# Patient Record
Sex: Female | Born: 1965
Health system: Southern US, Community
[De-identification: ages and names within clinical notes are randomized; demographics above are authoritative.]

## PROBLEM LIST (undated history)

## (undated) DIAGNOSIS — F1990 Other psychoactive substance use, unspecified, uncomplicated: Secondary | ICD-10-CM

## (undated) DIAGNOSIS — F988 Other specified behavioral and emotional disorders with onset usually occurring in childhood and adolescence: Secondary | ICD-10-CM

## (undated) DIAGNOSIS — E039 Hypothyroidism, unspecified: Secondary | ICD-10-CM

## (undated) DIAGNOSIS — R000 Tachycardia, unspecified: Secondary | ICD-10-CM

## (undated) DIAGNOSIS — E559 Vitamin D deficiency, unspecified: Secondary | ICD-10-CM

## (undated) DIAGNOSIS — M549 Dorsalgia, unspecified: Secondary | ICD-10-CM

## (undated) DIAGNOSIS — K219 Gastro-esophageal reflux disease without esophagitis: Secondary | ICD-10-CM

## (undated) DIAGNOSIS — I471 Supraventricular tachycardia: Secondary | ICD-10-CM

## (undated) DIAGNOSIS — R002 Palpitations: Secondary | ICD-10-CM

## (undated) DIAGNOSIS — L299 Pruritus, unspecified: Secondary | ICD-10-CM

## (undated) DIAGNOSIS — G9332 Myalgic encephalomyelitis/chronic fatigue syndrome: Secondary | ICD-10-CM

## (undated) DIAGNOSIS — R5383 Other fatigue: Secondary | ICD-10-CM

## (undated) DIAGNOSIS — R5382 Chronic fatigue, unspecified: Secondary | ICD-10-CM

## (undated) DIAGNOSIS — M2550 Pain in unspecified joint: Secondary | ICD-10-CM

## (undated) DIAGNOSIS — F32A Depression, unspecified: Secondary | ICD-10-CM

## (undated) DIAGNOSIS — T7840XA Allergy, unspecified, initial encounter: Secondary | ICD-10-CM

## (undated) DIAGNOSIS — F329 Major depressive disorder, single episode, unspecified: Secondary | ICD-10-CM

## (undated) DIAGNOSIS — R0602 Shortness of breath: Secondary | ICD-10-CM

## (undated) DIAGNOSIS — R600 Localized edema: Secondary | ICD-10-CM

## (undated) DIAGNOSIS — N979 Female infertility, unspecified: Secondary | ICD-10-CM

## (undated) DIAGNOSIS — F419 Anxiety disorder, unspecified: Secondary | ICD-10-CM

## (undated) DIAGNOSIS — J069 Acute upper respiratory infection, unspecified: Secondary | ICD-10-CM

## (undated) DIAGNOSIS — K5900 Constipation, unspecified: Secondary | ICD-10-CM

## (undated) HISTORY — DX: Shortness of breath: R06.02

## (undated) HISTORY — DX: Gastro-esophageal reflux disease without esophagitis: K21.9

## (undated) HISTORY — DX: Constipation, unspecified: K59.00

## (undated) HISTORY — DX: Allergy, unspecified, initial encounter: T78.40XA

## (undated) HISTORY — DX: Other psychoactive substance use, unspecified, uncomplicated: F19.90

## (undated) HISTORY — DX: Female infertility, unspecified: N97.9

## (undated) HISTORY — PX: BLADDER REPAIR: SHX76

## (undated) HISTORY — DX: Anxiety disorder, unspecified: F41.9

## (undated) HISTORY — DX: Acute upper respiratory infection, unspecified: J06.9

## (undated) HISTORY — DX: Pain in unspecified joint: M25.50

## (undated) HISTORY — DX: Tachycardia, unspecified: R00.0

## (undated) HISTORY — PX: OTHER SURGICAL HISTORY: SHX169

## (undated) HISTORY — DX: Myalgic encephalomyelitis/chronic fatigue syndrome: G93.32

## (undated) HISTORY — DX: Supraventricular tachycardia: I47.1

## (undated) HISTORY — DX: Vitamin D deficiency, unspecified: E55.9

## (undated) HISTORY — PX: ADENOIDECTOMY: SUR15

## (undated) HISTORY — DX: Hypothyroidism, unspecified: E03.9

## (undated) HISTORY — DX: Chronic fatigue, unspecified: R53.82

## (undated) HISTORY — DX: Dorsalgia, unspecified: M54.9

## (undated) HISTORY — DX: Major depressive disorder, single episode, unspecified: F32.9

## (undated) HISTORY — PX: LAPAROSCOPY: SHX197

## (undated) HISTORY — DX: Depression, unspecified: F32.A

## (undated) HISTORY — DX: Pruritus, unspecified: L29.9

## (undated) HISTORY — DX: Localized edema: R60.0

## (undated) HISTORY — DX: Other fatigue: R53.83

## (undated) HISTORY — DX: Other specified behavioral and emotional disorders with onset usually occurring in childhood and adolescence: F98.8

## (undated) HISTORY — DX: Palpitations: R00.2

---

## 1999-02-03 ENCOUNTER — Other Ambulatory Visit: Admission: RE | Admit: 1999-02-03 | Discharge: 1999-02-03 | Payer: Self-pay | Admitting: *Deleted

## 1999-03-07 ENCOUNTER — Emergency Department (HOSPITAL_COMMUNITY): Admission: EM | Admit: 1999-03-07 | Discharge: 1999-03-07 | Payer: Self-pay | Admitting: *Deleted

## 1999-11-30 ENCOUNTER — Other Ambulatory Visit: Admission: RE | Admit: 1999-11-30 | Discharge: 1999-11-30 | Payer: Self-pay | Admitting: *Deleted

## 2000-12-05 ENCOUNTER — Other Ambulatory Visit: Admission: RE | Admit: 2000-12-05 | Discharge: 2000-12-05 | Payer: Self-pay | Admitting: *Deleted

## 2002-04-15 ENCOUNTER — Ambulatory Visit (HOSPITAL_COMMUNITY): Admission: RE | Admit: 2002-04-15 | Discharge: 2002-04-15 | Payer: Self-pay | Admitting: Obstetrics and Gynecology

## 2002-05-06 ENCOUNTER — Inpatient Hospital Stay (HOSPITAL_COMMUNITY): Admission: RE | Admit: 2002-05-06 | Discharge: 2002-05-08 | Payer: Self-pay | Admitting: Obstetrics and Gynecology

## 2002-05-06 ENCOUNTER — Encounter (INDEPENDENT_AMBULATORY_CARE_PROVIDER_SITE_OTHER): Payer: Self-pay

## 2002-08-15 ENCOUNTER — Other Ambulatory Visit: Admission: RE | Admit: 2002-08-15 | Discharge: 2002-08-15 | Payer: Self-pay | Admitting: Obstetrics and Gynecology

## 2003-05-28 ENCOUNTER — Other Ambulatory Visit: Admission: RE | Admit: 2003-05-28 | Discharge: 2003-05-28 | Payer: Self-pay | Admitting: Obstetrics and Gynecology

## 2004-03-30 ENCOUNTER — Inpatient Hospital Stay (HOSPITAL_COMMUNITY): Admission: RE | Admit: 2004-03-30 | Discharge: 2004-04-05 | Payer: Self-pay | Admitting: Obstetrics and Gynecology

## 2004-04-09 ENCOUNTER — Emergency Department (HOSPITAL_COMMUNITY): Admission: EM | Admit: 2004-04-09 | Discharge: 2004-04-09 | Payer: Self-pay | Admitting: Emergency Medicine

## 2004-05-09 ENCOUNTER — Other Ambulatory Visit: Admission: RE | Admit: 2004-05-09 | Discharge: 2004-05-09 | Payer: Self-pay | Admitting: Obstetrics and Gynecology

## 2005-05-18 ENCOUNTER — Other Ambulatory Visit: Admission: RE | Admit: 2005-05-18 | Discharge: 2005-05-18 | Payer: Self-pay | Admitting: Obstetrics and Gynecology

## 2008-01-27 ENCOUNTER — Ambulatory Visit: Payer: Self-pay | Admitting: Internal Medicine

## 2008-01-27 DIAGNOSIS — F329 Major depressive disorder, single episode, unspecified: Secondary | ICD-10-CM | POA: Insufficient documentation

## 2008-01-27 DIAGNOSIS — Z87448 Personal history of other diseases of urinary system: Secondary | ICD-10-CM | POA: Insufficient documentation

## 2008-01-27 DIAGNOSIS — R519 Headache, unspecified: Secondary | ICD-10-CM | POA: Insufficient documentation

## 2008-01-27 DIAGNOSIS — J302 Other seasonal allergic rhinitis: Secondary | ICD-10-CM | POA: Insufficient documentation

## 2008-01-27 DIAGNOSIS — R51 Headache: Secondary | ICD-10-CM | POA: Insufficient documentation

## 2008-01-27 DIAGNOSIS — J309 Allergic rhinitis, unspecified: Secondary | ICD-10-CM

## 2008-01-27 DIAGNOSIS — F32A Depression, unspecified: Secondary | ICD-10-CM | POA: Insufficient documentation

## 2008-01-27 LAB — CONVERTED CEMR LAB
Glucose, Urine, Semiquant: NEGATIVE
Nitrite: NEGATIVE
Specific Gravity, Urine: 1.025
Urobilinogen, UA: 0.2

## 2008-02-15 ENCOUNTER — Encounter: Payer: Self-pay | Admitting: Emergency Medicine

## 2008-02-16 ENCOUNTER — Inpatient Hospital Stay (HOSPITAL_COMMUNITY): Admission: EM | Admit: 2008-02-16 | Discharge: 2008-02-17 | Payer: Self-pay | Admitting: Cardiology

## 2008-02-16 ENCOUNTER — Ambulatory Visit: Payer: Self-pay | Admitting: Cardiology

## 2008-02-17 ENCOUNTER — Encounter: Payer: Self-pay | Admitting: Internal Medicine

## 2008-03-03 ENCOUNTER — Ambulatory Visit: Payer: Self-pay

## 2008-03-03 ENCOUNTER — Ambulatory Visit: Payer: Self-pay | Admitting: Cardiology

## 2008-03-10 ENCOUNTER — Encounter: Payer: Self-pay | Admitting: Internal Medicine

## 2008-03-10 ENCOUNTER — Telehealth: Payer: Self-pay | Admitting: *Deleted

## 2008-03-10 ENCOUNTER — Telehealth: Payer: Self-pay | Admitting: Internal Medicine

## 2008-03-10 LAB — CONVERTED CEMR LAB
AST: 17 units/L (ref 0–37)
Albumin: 3.7 g/dL (ref 3.5–5.2)
Basophils Absolute: 0.1 10*3/uL (ref 0.0–0.1)
CO2: 27 meq/L (ref 19–32)
Calcium: 9 mg/dL (ref 8.4–10.5)
Chloride: 111 meq/L (ref 96–112)
Creatinine, Ser: 0.6 mg/dL (ref 0.4–1.2)
Eosinophils Absolute: 0.1 10*3/uL (ref 0.0–0.7)
GFR calc Af Amer: 141 mL/min
HCT: 37.4 % (ref 36.0–46.0)
LDL Cholesterol: 98 mg/dL (ref 0–99)
MCV: 88.5 fL (ref 78.0–100.0)
Monocytes Relative: 9 % (ref 3.0–12.0)
Neutro Abs: 4 10*3/uL (ref 1.4–7.7)
Neutrophils Relative %: 65.6 % (ref 43.0–77.0)
Potassium: 4.4 meq/L (ref 3.5–5.1)
RBC: 4.22 M/uL (ref 3.87–5.11)
Sodium: 144 meq/L (ref 135–145)
Total Bilirubin: 1 mg/dL (ref 0.3–1.2)
Total CHOL/HDL Ratio: 4.1

## 2008-04-22 ENCOUNTER — Ambulatory Visit: Payer: Self-pay | Admitting: Internal Medicine

## 2008-04-22 DIAGNOSIS — J019 Acute sinusitis, unspecified: Secondary | ICD-10-CM | POA: Insufficient documentation

## 2008-04-22 DIAGNOSIS — J069 Acute upper respiratory infection, unspecified: Secondary | ICD-10-CM | POA: Insufficient documentation

## 2008-04-23 ENCOUNTER — Telehealth: Payer: Self-pay | Admitting: Internal Medicine

## 2008-04-24 ENCOUNTER — Telehealth: Payer: Self-pay | Admitting: Internal Medicine

## 2008-08-17 ENCOUNTER — Encounter: Admission: RE | Admit: 2008-08-17 | Discharge: 2008-08-17 | Payer: Self-pay | Admitting: Obstetrics and Gynecology

## 2008-10-17 ENCOUNTER — Emergency Department (HOSPITAL_COMMUNITY): Admission: EM | Admit: 2008-10-17 | Discharge: 2008-10-17 | Payer: Self-pay | Admitting: Family Medicine

## 2010-11-29 NOTE — Discharge Summary (Signed)
NAMEMarland Kitchen  KINBERLY, PERRIS NO.:  [id number]   MEDICAL RECORD NO.:  [id number]          PATIENT TYPE:  INP   LOCATION:  2005                         FACILITY:  [facility]   PHYSICIAN:  Rollene Rotunda, MD, FACCDATE OF BIRTH:  17-Feb-1966   DATE OF ADMISSION:  02/16/2008  DATE OF DISCHARGE:  02/17/2008                               DISCHARGE SUMMARY   PROCEDURE:  Abdominal ultrasound.   PRIMARY FINAL DISCHARGE DIAGNOSIS:  Chest pain, cardiac enzymes negative  for myocardial infarction and outpatient Myoview planned.   SECONDARY DIAGNOSES:  1. Dyslipidemia with a total cholesterol of 151, triglycerides 67, HDL      35, and LDL 103.  2. Upper abdominal pain with abdominal ultrasound unremarkable,      amylase and lipase within normal limits.  3. Elevated TSH at 6.458 with a free T3 and T4 within normal limits.  4. Status post uterine myomectomy, laparoscopic surgery, C-section,      bladder tack, and polyps removal.  5. Allergy or intolerance to PHENERGAN.  6. Family history of coronary artery disease in her father.   TIME AT DISCHARGE:  36 minutes.   HOSPITAL COURSE:  Ms. Stapleton is a 45 year old female with no previous  history of coronary artery disease.  She went to the [facility] with chest discomfort and her EKG was a little abnormal.  She was  transferred to [facility] for further evaluation and treatment.   Her cardiac enzymes were negative for MI.  Lipid profile was mildly  abnormal as described above.  TSH was also mildly abnormal with a free  T3 and T4 within normal limits.  Serum pregnancy test was negative.  There was some concern for an abdominal etiology of his symptoms, but  amylase, lipase, and abdominal ultrasound were all within normal limits.  A D-dimer was also checked and was within normal limits.   On February 17, 2008, Ms. Gronewold was seen by Dr. Antoine Poche who felt she was  stable for discharge and outpatient stress testing.   DISCHARGE  INSTRUCTIONS:  She is to stick to a low-sodium, heart-healthy  diet.  She is to increase her activities slowly.  She is to follow up  with Dr. Antoine Poche on August 18 at 4 p.m.[name]  She is to get a stress test  on  August 4 at 8:15 a.m. with no food or drink after midnight before and no  caffeine.  She needs to follow up with Dr. Fabian Sharp as needed.   DISCHARGE MEDICATIONS:  Aspirin 81 mg daily.      Theodore Demark, PA-C      Rollene Rotunda, MD, [facility]  Electronically Signed    RB/MEDQ  D:  02/17/2008  T:  02/17/2008  Job:  [id number]   cc:   Neta Mends. Fabian Sharp, MD

## 2010-11-29 NOTE — Procedures (Signed)
[facility]                              EXERCISE TREADMILL   EVOLEHT, HOVATTER                         MRN:          [id number]  DATE:03/03/2008                            DOB:          05/03/1966    PROCEDURE:  Exercise treadmill test.   INDICATIONS:  The patient with chest pain and recent hospitalization.  She ruled out for myocardial infarction.   PROCEDURE NOTE:  The patient was exercised using standard Bruce  protocol.  She was able to exercise for 9 minutes.  This completed stage  III.  Test was terminated because she achieved her target heart rate.  She was fatigued, but probably could have gone a little  further.  She  achieved maximum heart rate of 181, which was 101% of predicted.  She  achieved had 10.4 METS.  Her blood pressure response was appropriate.  There was a spurious elevation in stage I.  Other blood pressures taken  were all consistently going up with exercise.  There was rare ectopy  during peak exercise.  There were PVCs and PACs.  There were no  sustained arrhythmias.  She had appropriate shortness of breath.  She  did not have chest discomfort.  She had no ischemic ST-T wave changes.  She had a normal heart rate recovery.   CONCLUSION:  Negative adequate exercise treadmill test.  There were no  high-risk features consistent with obstructive high-grade coronary  disease.  Therefore, no further cardiovascular testing is suggested at  this time.     Rollene Rotunda, MD, [facility]  Electronically Signed    JH/MedQ  DD: 03/03/2008  DT: 03/04/2008  Job #: [id number]   cc:   Neta Mends. Fabian Sharp, MD

## 2010-11-29 NOTE — H&P (Signed)
NAME:  [name], [name] NO.:  [id number]   MEDICAL RECORD NO.:  [id number]          PATIENT TYPE:  INP   LOCATION:  2005                         FACILITY:  [facility]   PHYSICIAN:  Rollene Rotunda, MD, FACCDATE OF BIRTH:  1965-08-26   DATE OF ADMISSION:  02/16/2008  DATE OF DISCHARGE:                              HISTORY & PHYSICAL   PRIMARY:  Neta Mends. Panosh, MD.   CARDIOLOGIST:  None.   REASON FOR PRESENTATION:  Evaluate the patient with chest pain.   HISTORY OF PRESENT ILLNESS:  The patient is a 45 year old white female  without prior cardiac history.  She presented today to the [facility] with chest discomfort.  This happened at rest.  She had a  sensation like there was an elephant on her chest.  It was about 5/10 in  intensity.  She had not had this discomfort before.  She had some pain  as well that radiated from her chest through to her back.  She developed  this after the pressure.  There was no radiation to her jaw or to her  arms.  She did feel like her heart was fluttering or skipping.  She has  had this before but it was slightly more intense than previous.  She  felt like she could not take a deep breath.  However, the discomfort was  not exacerbated by movement or breathing.  She felt fatigued.  She felt  a little diaphoretic.  She felt a little short of breath.  In [facility], she was not noted to have any acute EKG changes but was  referred to emergency room.  She refused an ambulance and was driven by  private car to the [facility] ED.  There on EKG, now pain  free, the patient had T-wave inversions in the anterior leads which was  changed from the one earlier in the day.  She has had no recurrent chest  discomfort.  Point of care markers were negative.  She was start on  nitroglycerin paste and sent here for further evaluation.  She has had  no further discomfort.   The patient does such activities as vacuuming and  chasing after a very  active 4-year-old.  She has had some increased fatigue with this.  She  also had some food intolerances and noted that she had been getting  somewhat nauseated with some food.  She has been a little more short of  breath doing such activities as vacuuming.  She has had some pain on the  right side about 2 weeks ago and some fleeting pain on left side more  recently.  She has not had any presyncope or syncope.  She has had no  PND or orthopnea.  She has had no prior cardiac testing.   Of note, the patient has taken a recent car trip to [place].  However,  she has had no calf tenderness.  Her D-dimer was less than 0.48 at the  emergency room.   PAST MEDICAL HISTORY:  The patient has no history of:  1. Hypertension.  2. Diabetes.  3. Hyperlipidemia.   PAST SURGICAL HISTORY:  1. Uterine myomectomy.  2. Polyps.  3. Laparoscopic surgery.  4. C-section.  5. Bladder surgery.   ALLERGIES/INTOLERANCES:  PHENERGAN MAKES HER NAUSEATED.   MEDICATIONS:  None.   SOCIAL HISTORY:  The patient is married.  She has one 28-year-old with  ADHD.  She quit smoking about 10 years ago.   FAMILY HISTORY:  Is contributory for her father having coronary disease  in his 10s.   REVIEW OF SYSTEMS:  As stated in the HPI, positive for fatigue, food  intolerance, stress at home.  Otherwise, negative for all other systems.   PHYSICAL EXAMINATION:  The patient is in no distress.  She has a  pleasant affect.  Blood pressure 110/63, heart rate 70, afebrile,  respiratory rate 16.  HEENT:  Eyelids unremarkable, pupils equal, round and react to light,  fundi not visualized, oral mucosa unremarkable.  NECK:  No jugular venous distention at 45 degrees, carotid upstroke  brisk and symmetric, no bruits, no thyromegaly.  LYMPHATICS:  No cervical, axillary or inguinal adenopathy.  LUNGS:  Clear to auscultation bilaterally.  BACK:  No costovertebral angle tenderness.  CHEST:  Unremarkable.   HEART:  PMI not displaced or sustained, S1 and S2 within normal limits,  no S3, no S4, no clicks, no rubs, no murmurs.  ABDOMEN:  Mildly obese, positive bowel sounds normal in frequency and  pitch, no bruits, no rebound, no guarding, no midline pulse, no mass, no  hepatomegaly, no splenomegaly.  SKIN:  No rashes, no nodules.  EXTREMITIES:  Two plus pulses throughout, no edema, no cyanosis, no  clubbing.  NEURO:  Oriented to person, place and time, cranial nerves II-XII  grossly intact, motor grossly intact.    EKG:  Sinus arrhythmia, rate 271, axis within normal limits, intervals  within normal limits, anterior mild T-wave inversions without ST-segment  changes lead V1-V3.   LABS:  WBC 6.7, hemoglobin 12.9, platelets 231, point of care markers  negative x1, D-dimer 0.45, UA negative.   Chest x-ray: No acute disease.   ASSESSMENT/PLAN:  1. Chest discomfort.  The patient's chest comfort has some typical and      atypical features.  The only objective finding is some mild T-wave      inversions in the anterior leads.  She has no significant      cardiovascular risk factors.  At this point, I think the pretest      probability of obstructive coronary disease as the etiology is at      best moderate.  There are some symptoms in recent review of systems      features that make this sound like a possible GI etiology with      cholelithiasis or cholecystitis a possibility.  Therefore, I am      going to have her on heparin and nitroglycerin and rule out      myocardial infarction.  I will repeat an electrocardiogram.      However, I am also going to check liver enzymes, lipase, amylase.      Will check a gallbladder ultrasound.  If the GI workup is negative      and there is no objective evidence of ischemia, I will proceed with      an outpatient stress Myoview.  Given the fact that she is of      childbearing age, we will check a pregnancy test unless I can  verify that she has had  a tubal ligation.  2. Obesity.  She will be counseled about weight loss and exercise and      she has been thinking about this.  Risk reduction.  Will check a      lipid profile.  3. Anxiety.  This will be managed per Dr. Fabian Sharp.      Rollene Rotunda, MD, [facility]  Electronically Signed     JH/MEDQ  D:  02/16/2008  T:  02/16/2008  Job:  [id number]

## 2010-12-02 NOTE — Op Note (Signed)
[name], [name]                            ACCOUNT NO.:  [id number]   MEDICAL RECORD NO.:  [id number]                   PATIENT TYPE:  INP   LOCATION:  9371                                 [facility]   PHYSICIAN:  Mark C. Vernie Ammons, M.D.               DATE OF BIRTH:  1966/01/23   DATE OF PROCEDURE:  04/03/2004  DATE OF DISCHARGE:                                 OPERATIVE REPORT   PREOPERATIVE DIAGNOSIS:  Spontaneous intraperitoneal bladder rupture.   POSTOPERATIVE DIAGNOSIS:  Spontaneous intraperitoneal bladder rupture.   OPERATION PERFORMED:  Exploratory laparotomy and repair of bladder rupture.   SURGEON:  Mark C. Vernie Ammons, M.D.   ASSISTANTStann Mainland. Vincente Poli, M.D.   ANESTHESIA:  General.   ESTIMATED BLOOD LOSS:  Less than 5 mL.   DRAINS:  A 20 French Foley catheter per urethra.   SPECIMENS:  None.   COMPLICATIONS:  None.   INDICATIONS FOR PROCEDURE:  The patient is a 45 year old white female who  had a cesarean section due to breech and nuchal cord at 36-1/2 weeks on  March 30, 2004.  She did well postoperatively but was noted to have  decreased urinary output the evening of her surgery.  She had a Foley  catheter in place that initially drained clear urine; however, when the  Foley catheter was changed in the evening, 1200 mL of clear urine was  returned.  She then had a fairly uneventful 24 hour period after which she  began to develop an ileus and a distended abdomen despite no fever.  Her  urine output continued to remain low with a normal hemoglobin and  hematocrit.  She was found to have elevated potassium of 6.1 with a BUN 36  and a creatinine of 4.3.  In the face of ileus and oliguria and with  elevated creatinine, a CT scan was obtained and revealed her abdomen was  full of fluid consistent with urine.  I was consulted and discussed with the  patient the need for exploration and closure of her bladder.   DESCRIPTION OF PROCEDURE:  After informed  consent, the patient was brought  to the major operating room, placed on the table and administered general  anesthesia and then moved into modified dorsal lithotomy position.  Her  abdomen and perineal region was sterilely prepped and draped.  Her  Pfannenstiel incision skin staples were then removed.  Subcutaneous tissues  parted and the fascial sutures cut opening the abdomen.  Several liters of  clear urine were then drained from the peritoneal cavity.  In the anterior  midline bladder, there was an obvious area where the bladder wall was thin  and the rupture had occurred.  I placed Deaver retractors in the bladder and  was able to inspect the bladder fully and noted no tumor, stones or  inflammatory lesion, no injury to the bladder elsewhere was noted.  The bladder was then closed in three layers, the first with 3-0 chromic  incorporating a portion of the muscularis and mucosa, the second with a  running 3-0 chromic.  I then used 2-0 chromic in a running locking fashion  to imbricate the first suture line and then imbricated the second suture  line for reinforcement purposes.  I then tested the bladder by instilling  several hundred mL of sterile saline through a new 20 French Foley catheter.  The suture line held with no leakage.  I therefore connected the Foley  catheter to closed system drainage.  The peritoneal cavity was kneaded with  the pool sucker in place with the patient in reverse Trendelenburg position  to in order to rid her peritoneal cavity of as much urine as possible.  I  then reapproximated the rectus muscle bellies with three interrupted 0  Vicryl sutures.  The rectus fascia was then reapproximated with running 0  Vicryl and the subcutaneous tissue copiously irrigated with saline.  The  skin was closed with skin staples.  The patient will be maintained on oral  antibiotics postoperatively.  She will be monitored in the intensive care  unit overnight and sponge,  needle and instrument counts were reportedly  correct times two at the end of the operation.      MCO/MEDQ  D:  04/03/2004  T:  04/04/2004  Job:  [id number]   cc:   Marcelino Duster L. Vincente Poli, M.D.  [address]  [place] [address]  Fax: [phone]

## 2010-12-02 NOTE — Consult Note (Signed)
NAME:  [name], [name] NO.:  [id number]   MEDICAL RECORD NO.:  [id number]          PATIENT TYPE:  EMS   LOCATION:  ED                           [facility]AC[facility]   PHYSICIAN:  Sigmund I. Patsi Sears, M.D.DATE OF BIRTH:  03-31-1966   DATE OF CONSULTATION:  04/09/2004  DATE OF DISCHARGE:                                   CONSULTATION   SUBJECTIVE:  [name] is a 45 year old married white female, ten days  status post C-section delivery and seven days status post incision and  drainage of urinoma and oversew of bladder perforation.  The patient has had  a Foley catheter for the last week.  She developed gross hematuria this  morning and is quite concerned.  She is referred by [name] for  evaluation.   REVIEW OF SYSTEMS:  Significant for no diabetes.  Tobacco none.  Alcohol  none.   PHYSICAL EXAMINATION:  GENERAL:  Today shows a healthy well-developed white  female with no acute distress.  VITAL SIGNS:  She has an O2 saturation of 99%.  Weight 162 pounds.  Pulse is  60, temperature 98.8, blood pressure is 118/78.  ABDOMEN:  Soft, plus bowel sounds without organomegaly.  Without masses.  Well-healed surgical incision.  GENITALIA:  Shows normal female external genitalia.  EXTREMITIES:  Without cyanosis or edema.  LYMPHATICS:  Nonpalpable.  PSYCHOLOGIC:  Normal orientation to time, person, and place.  CHEST:  Shows normal excursions.  NECK:  Supple, nontender.  No nodes.  SKIN:  Normal.   LABORATORY DATA:  CT scan shows that the patient has the bladder filled with  contrast.  There was no extravasation.  There is edema at the site of the  prior extravasation.   ASSESSMENT:  Stable.  There is no alarm noted.   PLAN:  The patient is consulted.  We will leave her Foley catheter in place.  Return on Monday to see [name].     SIT/MEDQ  D:  04/09/2004  T:  04/10/2004  Job:  [id number]   cc:   Veverly Fells. Vernie Smith, M.D.  [address]  [place]  [place] [address]  Fax: [phone]

## 2010-12-02 NOTE — Discharge Summary (Signed)
   [name], [name]                            ACCOUNT NO.:  [id number]   MEDICAL RECORD NO.:  [id number]                   PATIENT TYPE:  INP   LOCATION:  9136                                 [facility]ACILITY[facility]   PHYSICIAN:  Michelle L. Vincente Poli, M.D.            DATE OF BIRTH:  [birth year]   DATE OF ADMISSION:  05/06/2002  DATE OF DISCHARGE:  05/08/2002                                 DISCHARGE SUMMARY   ADMISSION DIAGNOSIS:  Symptomatic fibroids.   DISCHARGE DIAGNOSIS:  Symptomatic fibroids status post abdominal myomectomy.   HOSPITAL COURSE:  The patient is a 45 year old gravida 0 with symptomatic  fibroids.  On the day of admission, she was admitted and underwent abdominal  myomectomy.  She did very well postop.  Her postop hemoglobin on postop day  #1 was 10.9 and white blood cell count was 7.7.  She was discharged home in  good condition on postop day #2.  She was ambulating, tolerating a regular  diet and had good pain control.   DISCHARGE MEDICATIONS:  She was given a prescription for Tylox.   FOLLOW UP:  She will follow up in the office in 4-6 weeks.   DISCHARGE INSTRUCTIONS:  She was advised no driving for two weeks, and she  is advised to call if she has any redness or drainage from incision site,  nausea, vomiting, temperature greater than 100.5 or severe abdominal pain.                                               Michelle L. Vincente Poli, M.D.    Florestine Avers  D:  06/03/2002  T:  06/03/2002  Job:  [id number]

## 2010-12-02 NOTE — Op Note (Signed)
[name], [name]                            ACCOUNT NO.:  [id number]   MEDICAL RECORD NO.:  [id number]                   PATIENT TYPE:  INP   LOCATION:  9103                                 [facility]ILITY[facility]   PHYSICIAN:  Michelle L. Vincente Poli, M.D.            DATE OF BIRTH:  27-Mar-1966   DATE OF PROCEDURE:  03/30/2004  DATE OF DISCHARGE:                                 OPERATIVE REPORT   PREOPERATIVE DIAGNOSES:  1.  Intrauterine pregnancy at 36 weeks.  2.  Previous myomectomy.  3.  Homero Fellers breech presentation.  4.  Nuchal cord.   POSTOPERATIVE DIAGNOSES:  1.  Intrauterine pregnancy at 36 weeks.  2.  Previous myomectomy.  3.  Homero Fellers breech presentation.  4.  Nuchal cord.   PROCEDURE:  Primary low flap transverse cesarean section.   SURGEON:  Michelle L. Vincente Poli, M.D.   ANESTHESIA:  Spinal.   ESTIMATED BLOOD LOSS:  500 cc.   FINDINGS:  Female infant in a frank breech position with nuchal arm and cord  in mouth.  Apgars 9 at one minute and 9 at five minutes, weight 5 lbs 7 oz.   DESCRIPTION OF PROCEDURE:  The patient was taken to the operating room.  She  was then prepped and draped after a spinal was placed without incident.  Foley catheter was placed in the bladder.  A low transverse incision was  made at the level of the previous surgical scar and carried down to the  fascia.  The fascia was scored in the midline and extended laterally.  The  peritoneum was entered bluntly.  The peritoneal incision was then stretched.  The bladder blade was inserted and the lower uterine segment was identified,  and the bladder flap was carried sharply and entered digitally.  The bladder  blade was then readjusted.  A low transverse incision was made in the  uterus.  The amniotic fluid was clear.  The baby was in the frank breech  presentation and was delivered quite easily.  Of note, there were nuchal  arms and there was cord wrapped all in the baby's mouth.  Also, the head was  hyperextended.   The baby was a female infant, weighing 5 pounds 7 ounces.  Apgars were 9 at one minute and 9 at five minutes.  The cord was clamped and  cut and the baby was handed to the awaiting pediatricians.  The placenta was  removed and noted to be normal and intact with three vessel cord.  The  uterus was exteriorized and cleared of all clots and debris.  The uterine  incision was closed with 0 Vicryl continuous running locked stitch  The  incision was hemostatic.  The uterus was returned to the abdomen.  The  irrigation was performed.  The incision was hemostatic.  The peritoneum was  closed using 0 Vicryl continuous running stitch and the rectus muscles were  reapproximated using  7-0 Vicryl.  The fascia was closed using 0 Vicryl  stitch starting at each corner and meeting in  the midline.  After irrigation of the subcutaneous layers, the skin was  closed with staples.  All sponge, lap and instrument counts were correct x2.  The patient tolerated the procedure well and went to the recovery room in  stable condition.                                               Michelle L. Vincente Poli, M.D.    Florestine Avers  D:  03/30/2004  T:  03/30/2004  Job:  [id number]

## 2010-12-02 NOTE — Op Note (Signed)
[name], [name]                            ACCOUNT NO.:  [id number]   MEDICAL RECORD NO.:  [id number]                   PATIENT TYPE:  INP   LOCATION:  NA                                   FACILITY:  [facility]   PHYSICIAN:  Michelle L. Vincente Poli, M.D.            DATE OF BIRTH:  01-10-1966   DATE OF PROCEDURE:  05/06/2002  DATE OF DISCHARGE:                                 OPERATIVE REPORT   PREOPERATIVE DIAGNOSIS:  Pelvic pain and fibroids.   POSTOPERATIVE DIAGNOSIS:  Pelvic pain and fibroids.   PROCEDURE:  Abdominal myomectomy.   SURGEON:  Michelle L. Vincente Poli, M.D.   ASSISTANT:  Juluis Mire, M.D.   ANESTHESIA:  Combined spinal epidural anesthesia.   ESTIMATED BLOOD LOSS:  100 cc.   FINDINGS:  One large posterior fibroid.  One smaller fundal fibroid and four  fibroids less than 1 cm.   DESCRIPTION OF PROCEDURE:  The patient was taken to the operating room.  She  was given combined spinal epidural without difficulty.  The abdomen was  prepped and draped in the usual sterile fashion.  Foley catheter was placed  in the bladder.  Using a scalpel, low transverse incision was made, carried  down to the fascia with good hemostasis.  The fascia was scored in the  midline and extended laterally.  The Pfannenstiel incision was created. The  peritoneum was entered bluntly and the peritoneal incision was then  stretched.  The self-retaining retractor was placed in the abdomen.  The  large and small-bowel were packed in the upper abdomen using a laparotomy  sponge and the pelvis was visualized.  The ovaries appeared within normal  limits.  The tubes appeared normal.  Uterus was enlarged and myomatous.  There was one large posterior fibroid, approximately 7 cm in diameter.  A  towel clamp was used to grasp the fibroid and it was dissected free using  electrocautery.  There was one smaller 3 cm fibroid just below this and that  was dissected free as well.  The endometrial cavity was not  entered with  this procedure.  The incision was then closed in layered stitch using  continuous running stitch using 3-0 Vicryl suture and there was good  hemostasis.  Interceed was placed over the incision as well.  There were  several small little fibroids on the serosal surface.  They were removed  using electrocautery with good hemostasis.  Irrigation was performed.  There  was no bleeding noted from the incision and the patient was tolerating the  procedure well.  All instruments were removed from the abdominal cavity.  The peritoneum was closed using 0 Vicryl in continuous running stitch and  the rectus muscles were reapproximated using the same 0 Vicryl.  The fascia  was closed using 0 Vicryl starting at each corner and meeting in the midline  in a continuous running stitch.  After inspection of  the subcutaneous  layers, the skin was closed with staples.  All sponge, lap and instrument  counts correct x2.  The patient tolerated the procedure well and went to the  recovery room in stable condition.                                               Michelle L. Vincente Poli, M.D.   Florestine Avers  D:  05/06/2002  T:  05/06/2002  Job:  [id number]

## 2010-12-02 NOTE — Discharge Summary (Signed)
NAMEMarland Kitchen  RANDALYN, AHMED NO.:  [id number]   MEDICAL RECORD NO.:  [id number]          PATIENT TYPE:  INP   LOCATION:  9134                          FACILITY:  [facility]   PHYSICIAN:  Duke Salvia. Marcelle Overlie, M.D.DATE OF BIRTH:  February 16, 1966   DATE OF ADMISSION:  03/30/2004  DATE OF DISCHARGE:  04/05/2004                                 DISCHARGE SUMMARY   ADMISSION DIAGNOSES:  1.  Intrauterine pregnancy at 67 weeks estimated gestational age.  2.  Previous myomectomy.  3.  Homero Fellers breech presentation.   DISCHARGE DIAGNOSES:  1.  Status post low transverse cesarean section.  2.  Viable female infant.  3.  Status post exploratory laparotomy with repair of bladder rupture.   PROCEDURE:  1.  Primary low transverse cesarean section.  2.  Exploratory laparotomy with repair of bladder rupture.   REASON FOR ADMISSION:  Please see written H&P.   HOSPITAL COURSE:  The patient is a 45 year old, gravida 5, para 0 that was  admitted to [facility] for a scheduled cesarean delivery.  The patient had had a previous myomectomy.  The patient had been seen in the  office on the previous day prior to admission and it was noted by ultrasound  the baby was in the breech presentation with a tight nuchal cord.  On  admission, the patient was taken to the operating room where spinal  anesthesia was administered without difficulty. A low transverse incision  was made with a delivery of a viable female infant in the frank breech  presentation that weighed 5 pounds, 7 ounces with Apgar's of 9 at 1 minute  and 9 at 5 minutes.  The patient tolerated the procedure well and was taken  to the recovery room in stable condition.  On postoperative day one, the  patient was without complaint, vital signs were stable, she was afebrile,  abdomen soft with good return of bowel function. The fundus was firm and  nontender, abdominal dressing was saturated with old blood. The dressing was  partially  removed revealing and incision that was clean, dry and intact  without active bleeding. Urine output was noted to be adequate.  Labs  revealed a hemoglobin of 10.3, platelet count of 165,000, WBC count of 8.9.  On postoperative day two, the patient did complain of some increase in  abdominal gas, vital signs were stable, she was afebrile, abdomen was  slightly distended, however, she did have good bowel sounds and had had a BM  on the previous day. The fundus was firm and nontender. Incision was clean,  dry and intact. The patient was given some warm beverages and a Dulcolax  suppository.  On postoperative day three, the patient complained of feeling  miserable. The patient had had an abdominal x-ray which had revealed that  she had an ileus. The patient was not having any vomitus and did feel  hungry, vital signs remained stable. She was afebrile, abdomen was distended  and diffusely tender. There were a few scattered bowel sounds. Incision was  clean, dry and intact.  The patient was made n.p.o.,  given IV fluids and a  rectal tube was inserted.  Later that evening, the patient had ambulated  well, she had tried a rectal tube without relief and an NG tube without  relief.  The abdomen continued to be distended and there were a few  scattered bowel sounds. Consult was made with Anselm Pancoast. Weatherly, M.D. and  a plain film was repeated, a CBC and a CMET. The patient continued n.p.o.  with a nasogastric tube.  Later than evening, the patient continued in a lot  of pain, blood pressure was noted to be 100/60 to 70's, urine output was low  today. After a 500 mL bolus of IV fluids, there was approximately 200 mL  noted in the Foley. Abdomen still was very distended with a few scattered  bowel sounds. Labs revealed a hemoglobin of 12.8, WBC count of 10.3 and  platelet count of 294,000. Liver function tests were within normal limits.  The patient was noted to have decreased urine output and was  noted to have  an increase in creatinine level.  Concern was then patient was developing an  early sepsis secondary to questionable bowel obstruction or bowel  perforation. A CT scan was ordered and a consult with the nephrologist,  Maree Krabbe, M.D. was ordered.  The CT scan did return which was  consistent with a bladder perforation secondary to a distended bladder.  Postoperatively, Mark C. Vernie Ammons, M.D., urologist, was also called in for  assistance with an exploratory laparotomy and repair of the bladder  perforation.  On postoperative day four, the patient was feeling much  better, vital signs were stable, she was afebrile, urine output was good.  Abdominal distention was significantly decreased. Abdominal dressing was  noted to be clean, dry and intact, creatinine level was down to 1.6.  The  patient continued with the Foley and IV antibiotics. She was given sips of  water and was encouraged to ambulate.  Later in the day, the patient was  feeling much better, she was tolerating clear fluid, vital signs were  stable, she was afebrile, urine output was good, abdomen only slightly  distended and bowel sounds were significantly improved.  On postoperative  day five and one, the patient was without complaint, vital signs remained  stable, urine output was excellent, abdomen soft with good return of bowel  function. The incision was noted to be clean, dry and intact. The patient  continued to be followed by a urologist. A decision was made anticipating a  discharge in the morning, the patient would go home with a Foley catheter  and continue on Cipro antibiotics.  On the following morning, the patient  was without complaint, however, she was somewhat teary. She was sad in  reference to the death of her mother which had occurred two years previous.  Vital signs were stable, she was afebrile, abdomen soft, Foley continued to be draining adequate clear urine. The fundus was firm and  nontender,  incision was clean, dry and intact. Staples were left in and the patient was  discharged home.   CONDITION ON DISCHARGE:  Stable.   DIET:  Regular as tolerated.   ACTIVITY:  No heavy lifting, no driving x2 weeks, no vaginal entry.   FOLLOW UP:  The patient is to followup in the office in one week for an  incision check. She is to call for a temperature of greater than 100  degrees, persistent nausea and vomiting, heavy vaginal bleeding and/or  redness  or drainage from the incisional site.   DISCHARGE MEDICATIONS:  1.  Ciprofloxacin 250 mg, 1 p.o. b.i.d. x7.  2.  Motrin 600 mg q.6 h.     Catalina Gravel   CC/MEDQ  D:  05/05/2004  T:  05/05/2004  Job:  [id number]

## 2010-12-02 NOTE — Op Note (Signed)
   NAMEMADDISON, KILNER                            ACCOUNT NO.:  [id number]   MEDICAL RECORD NO.:  [id number]                   PATIENT TYPE:  AMB   LOCATION:  SDC                                  FACILITY:  [facility]   PHYSICIAN:  Michelle L. Vincente Poli, M.D.            DATE OF BIRTH:  October 15, 1965   DATE OF PROCEDURE:  DATE OF DISCHARGE:                                 OPERATIVE REPORT   PREOPERATIVE DIAGNOSIS:  Pelvic pain.   POSTOPERATIVE DIAGNOSIS:  1. Pelvic pain.  2. Uterine fibroid.   PROCEDURE:  Laparoscopy and chromopertubation   SURGEON:  Michelle L. Vincente Poli, M.D.   ANESTHESIA:  General.   ESTIMATED BLOOD LOSS:  Minimal.   FINDINGS:  Large posterior fibroid and patent fallopian tubes.   DESCRIPTION OF PROCEDURE:  The patient was taken to the operating room.  She  was intubated without difficulty.  She was then placed in the lithotomy  position.  Foley catheter was placed in the bladder.  The abdomen, vagina  and vulva were prepped and draped in the usual sterile fashion using a  scalpel.  Infraumbilical incision was made, and the Veress needle was  inserted without difficulty.  The pneumoperitoneum was achieved, and then  the patient was placed in Trendelenburg position.  A 10 mm trocar was  inserted.  The laparoscope was immediately introduced into the abdominal  cavity.  There was no bleeding noted.  The upper abdomen was inspected and  noted to be normal.  The lower abdomen was inspected.  At this point, a 5 mm  trocar was inserted under direct visualization in the suprapubic region.  Using a probe, we then inspected the pelvis carefully.  Both ovaries  appeared normal.  The fallopian tubes appeared normal.  Chromopertubation  revealed a normal fill and spillage of the tubes bilaterally.  There was no  evidence of endometriosis.  However, there were several small subserosal  fibroids noted, and one large intramural fibroid noted posterior which  pretty much obliterated the  cul-de-sac.  I feel that the small fibroid is  the etiology of the patient's pain.  At the end of the procedure, all  instruments were removed.  Vicryl was used at incision site and the  incisions were closed with 3-0 Vicryl.  All sponge, lap and instrument  counts were correct x2.  The patient tolerated the procedure well and went  to the recovery room in good condition.                                               Michelle L. Vincente Poli, M.D.    Florestine Avers  D:  04/15/2002  T:  04/15/2002  Job:  [id number]

## 2011-04-14 LAB — COMPREHENSIVE METABOLIC PANEL
ALT: 15
AST: 18
Albumin: 3.4 — ABNORMAL LOW
Alkaline Phosphatase: 49
Calcium: 9.1
Creatinine, Ser: 0.57
Glucose, Bld: 88
Potassium: 3.8
Total Bilirubin: 0.8
Total Protein: 6

## 2011-04-14 LAB — CBC
HCT: 36.1
MCHC: 34.6
MCV: 88.4
Platelets: 212
WBC: 6.8

## 2011-04-14 LAB — LIPID PANEL
Cholesterol: 151
LDL Cholesterol: 103 — ABNORMAL HIGH
Total CHOL/HDL Ratio: 4.3
Triglycerides: 67

## 2011-04-14 LAB — POCT CARDIAC MARKERS
Myoglobin, poc: 30.8
Troponin i, poc: <0.05

## 2011-04-14 LAB — URINALYSIS, ROUTINE W REFLEX MICROSCOPIC
Bilirubin Urine: NEGATIVE
Glucose, UA: NEGATIVE
Hgb urine dipstick: NEGATIVE
Nitrite: NEGATIVE
Urobilinogen, UA: 0.2
pH: 7

## 2011-04-14 LAB — CARDIAC PANEL(CRET KIN+CKTOT+MB+TROPI)
CK, MB: 0.6
CK, MB: 0.7
Relative Index: INVALID
Relative Index: INVALID
Total CK: 48
Troponin I: <0.01

## 2011-04-14 LAB — TSH: TSH: 6.458 — ABNORMAL HIGH

## 2011-04-14 LAB — T4, FREE: Free T4: 1.13

## 2011-04-14 LAB — DIFFERENTIAL
Basophils Absolute: 0.1
Basophils Relative: 1
Monocytes Absolute: 0.4
Monocytes Relative: 6
Neutrophils Relative %: 66

## 2011-04-14 LAB — HEPARIN LEVEL (UNFRACTIONATED): Heparin Unfractionated: 0.71 — ABNORMAL HIGH

## 2011-10-30 ENCOUNTER — Other Ambulatory Visit: Payer: Self-pay | Admitting: Obstetrics and Gynecology

## 2013-02-05 ENCOUNTER — Other Ambulatory Visit: Payer: Self-pay | Admitting: Obstetrics and Gynecology

## 2014-02-12 ENCOUNTER — Other Ambulatory Visit: Payer: Self-pay | Admitting: Obstetrics and Gynecology

## 2014-02-13 LAB — CYTOLOGY - PAP

## 2014-04-15 ENCOUNTER — Other Ambulatory Visit: Payer: Self-pay | Admitting: Physician Assistant

## 2015-02-07 ENCOUNTER — Emergency Department (HOSPITAL_COMMUNITY)
Admission: EM | Admit: 2015-02-07 | Discharge: 2015-02-07 | Disposition: A | Discharge status: Home / Self Care | Payer: BLUE CROSS/BLUE SHIELD | Attending: Emergency Medicine | Admitting: Emergency Medicine

## 2015-02-07 ENCOUNTER — Emergency Department (HOSPITAL_COMMUNITY): Payer: BLUE CROSS/BLUE SHIELD

## 2015-02-07 ENCOUNTER — Encounter (HOSPITAL_COMMUNITY): Payer: Self-pay | Admitting: Emergency Medicine

## 2015-02-07 DIAGNOSIS — Z3202 Encounter for pregnancy test, result negative: Secondary | ICD-10-CM | POA: Insufficient documentation

## 2015-02-07 DIAGNOSIS — R112 Nausea with vomiting, unspecified: Secondary | ICD-10-CM | POA: Diagnosis not present

## 2015-02-07 DIAGNOSIS — Z880 Allergy status to penicillin: Secondary | ICD-10-CM | POA: Diagnosis not present

## 2015-02-07 DIAGNOSIS — Z79899 Other long term (current) drug therapy: Secondary | ICD-10-CM | POA: Diagnosis not present

## 2015-02-07 DIAGNOSIS — R197 Diarrhea, unspecified: Secondary | ICD-10-CM | POA: Insufficient documentation

## 2015-02-07 DIAGNOSIS — T675XXA Heat exhaustion, unspecified, initial encounter: Secondary | ICD-10-CM | POA: Diagnosis not present

## 2015-02-07 DIAGNOSIS — Y9289 Other specified places as the place of occurrence of the external cause: Secondary | ICD-10-CM | POA: Diagnosis not present

## 2015-02-07 DIAGNOSIS — Y9389 Activity, other specified: Secondary | ICD-10-CM | POA: Diagnosis not present

## 2015-02-07 DIAGNOSIS — Y998 Other external cause status: Secondary | ICD-10-CM | POA: Diagnosis not present

## 2015-02-07 DIAGNOSIS — R0602 Shortness of breath: Secondary | ICD-10-CM | POA: Diagnosis not present

## 2015-02-07 DIAGNOSIS — T673XXA Heat exhaustion, anhydrotic, initial encounter: Secondary | ICD-10-CM | POA: Diagnosis present

## 2015-02-07 DIAGNOSIS — X32XXXA Exposure to sunlight, initial encounter: Secondary | ICD-10-CM | POA: Insufficient documentation

## 2015-02-07 LAB — COMPREHENSIVE METABOLIC PANEL
ALBUMIN: 3.8 g/dL (ref 3.5–5.0)
ALK PHOS: 50 U/L (ref 38–126)
ALT: 18 U/L (ref 14–54)
ANION GAP: 11 (ref 5–15)
AST: 21 U/L (ref 15–41)
BUN: 16 mg/dL (ref 6–20)
CO2: 21 mmol/L — ABNORMAL LOW (ref 22–32)
Calcium: 9 mg/dL (ref 8.9–10.3)
Chloride: 106 mmol/L (ref 101–111)
Creatinine, Ser: 0.78 mg/dL (ref 0.44–1.00)
GFR calc Af Amer: >60 mL/min (ref >60)
GFR calc non Af Amer: >60 mL/min (ref >60)
Glucose, Bld: 129 mg/dL — ABNORMAL HIGH (ref 65–99)
POTASSIUM: 4.1 mmol/L (ref 3.5–5.1)
SODIUM: 138 mmol/L (ref 135–145)
TOTAL PROTEIN: 6.9 g/dL (ref 6.5–8.1)
Total Bilirubin: 0.7 mg/dL (ref 0.3–1.2)

## 2015-02-07 LAB — CBC WITH DIFFERENTIAL/PLATELET
BASOS ABS: 0 10*3/uL (ref 0.0–0.1)
BASOS PCT: 0 % (ref 0–1)
Eosinophils Absolute: 0.1 10*3/uL (ref 0.0–0.7)
Eosinophils Relative: 1 % (ref 0–5)
HEMATOCRIT: 35.6 % — AB (ref 36.0–46.0)
HEMOGLOBIN: 12.3 g/dL (ref 12.0–15.0)
LYMPHS PCT: 14 % (ref 12–46)
Lymphs Abs: 1.4 10*3/uL (ref 0.7–4.0)
MCH: 29.4 pg (ref 26.0–34.0)
MCHC: 34.6 g/dL (ref 30.0–36.0)
MCV: 85.2 fL (ref 78.0–100.0)
MONO ABS: 0.6 10*3/uL (ref 0.1–1.0)
MONOS PCT: 6 % (ref 3–12)
Neutro Abs: 7.8 10*3/uL — ABNORMAL HIGH (ref 1.7–7.7)
Neutrophils Relative %: 79 % — ABNORMAL HIGH (ref 43–77)
Platelets: 187 10*3/uL (ref 150–400)
RBC: 4.18 MIL/uL (ref 3.87–5.11)
RDW: 13.8 % (ref 11.5–15.5)
WBC: 9.9 10*3/uL (ref 4.0–10.5)

## 2015-02-07 LAB — URINALYSIS, ROUTINE W REFLEX MICROSCOPIC
BILIRUBIN URINE: NEGATIVE
GLUCOSE, UA: NEGATIVE mg/dL
HGB URINE DIPSTICK: NEGATIVE
Leukocytes, UA: NEGATIVE
Nitrite: NEGATIVE
PH: 7.5 (ref 5.0–8.0)
PROTEIN: NEGATIVE mg/dL
Specific Gravity, Urine: 1.023 (ref 1.005–1.030)
Urobilinogen, UA: 0.2 mg/dL (ref 0.0–1.0)

## 2015-02-07 LAB — POC URINE PREG, ED: PREG TEST UR: NEGATIVE

## 2015-02-07 LAB — I-STAT TROPONIN, ED: Troponin i, poc: 0 ng/mL (ref 0.00–0.08)

## 2015-02-07 MED ORDER — DIPHENHYDRAMINE HCL 50 MG/ML IJ SOLN
12.5000 mg | Freq: Once | INTRAMUSCULAR | Status: AC
  Administered 2015-02-07: 12.5 mg via INTRAVENOUS
  Filled 2015-02-07: qty 1

## 2015-02-07 MED ORDER — SODIUM CHLORIDE 0.9 % IV BOLUS (SEPSIS)
1000.0000 mL | INTRAVENOUS | Status: AC
  Administered 2015-02-07: 1000 mL via INTRAVENOUS

## 2015-02-07 MED ORDER — ACETAMINOPHEN 325 MG PO TABS
650.0000 mg | ORAL_TABLET | Freq: Once | ORAL | Status: AC
  Administered 2015-02-07: 650 mg via ORAL
  Filled 2015-02-07: qty 2

## 2015-02-07 MED ORDER — METOCLOPRAMIDE HCL 5 MG/ML IJ SOLN
5.0000 mg | Freq: Once | INTRAMUSCULAR | Status: AC
  Administered 2015-02-07: 5 mg via INTRAVENOUS
  Filled 2015-02-07: qty 2

## 2015-02-07 NOTE — Discharge Instructions (Signed)
Heat-Related Illness °Heat-related illnesses occur when the body is unable to properly cool itself. The body normally cools itself by sweating. However, under some conditions sweating is not enough. In these cases, a person's body temperature rises rapidly. Very high body temperatures may damage the brain or other vital organs. Some examples of heat-related illnesses include: °· Heat stroke. This occurs when the body is unable to regulate its temperature. The body's temperature rises rapidly, the sweating mechanism fails, and the body is unable to cool down. Body temperature may rise to 106° F (41° C) or higher within 10 to 15 minutes. Heat stroke can cause death or permanent disability if emergency treatment is not provided. °· Heat exhaustion. This is a milder form of heat-related illness that can develop after several days of exposure to high temperatures and not enough fluids. It is the body's response to an excessive loss of the water and salt contained in sweat. °· Heat cramps. These usually affect people who sweat a lot during heavy activity. This sweating drains the body's salt and moisture. The low salt level in the muscles causes painful cramps. Heat cramps may also be a symptom of heat exhaustion. Heat cramps usually occur in the abdomen, arms, or legs. Get medical attention for cramps if you have heart problems or are on a low-sodium diet. °Those that are at greatest risk for heat-related illnesses include:  °· The elderly. °· Infant and the very young. °· People with mental illness and chronic diseases. °· People who are overweight (obese). °· Young and healthy people can even succumb to heat if they participate in strenuous physical activities during hot weather. °CAUSES  °Several factors affect the body's ability to cool itself during extremely hot weather. When the humidity is high, sweat will not evaporate as quickly. This prevents the body from releasing heat quickly. Other factors that can affect  the body's ability to cool down include:  °· Age. °· Obesity. °· Fever. °· Dehydration. °· Heart disease. °· Mental illness. °· Poor circulation. °· Sunburn. °· Prescription drug use. °· Alcohol use. °SYMPTOMS  °Heat stroke: Warning signs of heat stroke vary, but may include: °· An extremely high body temperature (above 103°F orally). °· A fast, strong pulse. °· Dizziness. °· Confusion. °· Red, hot, and dry skin. °· No sweating. °· Throbbing headache. °· Feeling sick to your stomach (nauseous). °· Unconsciousness. °Heat exhaustion: Warning signs of heat exhaustion include: °· Heavy sweating. °· Tiredness. °· Headache. °· Paleness. °· Weakness. °· Feeling sick to your stomach (nauseous) or vomiting. °· Muscle cramps. °Heat cramps °· Muscle pains or spasms. °TREATMENT  °Heat stroke °· Get into a cool environment. An indoor place that is air-conditioned may be best. °· Take a cool shower or bath. Have someone around to make sure you are okay. °· Take your temperature. Make sure it is going down. °Heat exhaustion °· Drink plenty of fluids. Do not drink liquids that contain caffeine, alcohol, or large amounts of sugar. These cause you to lose more body fluid. Also, avoid very cold drinks. They can cause stomach cramps. °· Get into a cool environment. An indoor place that is air-conditioned may be best. °· Take a cool shower or bath. Have someone around to make sure you are okay. °· Put on lightweight clothing. °Heat cramps °· Stop whatever activity you were doing. Do not attempt to do that activity for at least 3 hours after the cramps have gone away. °· Get into a cool environment. An indoor   place that is air-conditioned may be best. °HOME CARE INSTRUCTIONS  °To protect your health when temperatures are extremely high, follow these tips: °· During heavy exercise in a hot environment, drink two to four glasses (16-32 ounces) of cool fluids each hour. Do not wait until you are thirsty to drink. Warning: If your caregiver  limits the amount of fluid you drink or has you on water pills, ask how much you should drink while the weather is hot. °· Do not drink liquids that contain caffeine, alcohol, or large amounts of sugar. These cause you to lose more body fluid. °· Avoid very cold drinks. They can cause stomach cramps. °· Wear appropriate clothing. Choose lightweight, light-colored, loose-fitting clothing. °· If you must be outdoors, try to limit your outdoor activity to morning and evening hours. Try to rest often in shady areas. °· If you are not used to working or exercising in a hot environment, start slowly and pick up the pace gradually. °· Stay cool in an air-conditioned place if possible. If your home does not have air conditioning, go to the shopping mall or public library. °· Taking a cool shower or bath may help you cool off. °SEEK MEDICAL CARE IF:  °· You see any of the symptoms listed above. You may be dealing with a life-threatening emergency. °· Symptoms worsen or last longer than 1 hour. °· Heat cramps do not get better in 1 hour. °MAKE SURE YOU:  °· Understand these instructions. °· Will watch your condition. °· Will get help right away if you are not doing well or get worse. °Document Released: 04/11/2008 Document Revised: 09/25/2011 Document Reviewed: 04/11/2008 °[employer]® Patient Information ©2015 [employer]. This information is not intended to replace advice given to you by your health care provider. Make sure you discuss any questions you have with your health care provider. ° °

## 2015-02-07 NOTE — ED Provider Notes (Signed)
CSN: [id number]     Arrival date & time 02/07/15  1530 History   First MD Initiated Contact with Patient 02/07/15 1534     Chief Complaint  Patient presents with  . Heat Exposure     (Consider location/radiation/quality/duration/timing/severity/associated sxs/prior Treatment) Patient is a 49 y.o. female presenting with headaches. The history is provided by the patient.  Headache Pain location:  Frontal Quality:  Dull Radiates to:  L shoulder and R shoulder Severity currently:  5/10 Severity at highest:  9/10 Onset quality:  Gradual Duration:  4 hours Timing:  Constant Progression:  Improving Chronicity:  New Context comment:  While mowing the lawn Relieved by:  Nothing Worsened by:  Nothing Ineffective treatments: water, cold shower. Associated symptoms: diarrhea, nausea and vomiting   Associated symptoms: no abdominal pain, no back pain, no congestion, no cough, no dizziness, no eye pain, no fatigue, no fever and no neck pain     History reviewed. No pertinent past medical history. History reviewed. No pertinent past surgical history. No family history on file. History  Substance Use Topics  . Smoking status: Never Smoker   . Smokeless tobacco: Not on file  . Alcohol Use: No   OB History    No data available     Review of Systems  Constitutional: Negative for fever and fatigue.  HENT: Negative for congestion and drooling.   Eyes: Negative for pain.  Respiratory: Positive for shortness of breath. Negative for cough.   Cardiovascular: Negative for chest pain.  Gastrointestinal: Positive for nausea, vomiting and diarrhea. Negative for abdominal pain.  Genitourinary: Negative for dysuria and hematuria.  Musculoskeletal: Negative for back pain, gait problem and neck pain.  Skin: Negative for color change.  Neurological: Positive for headaches. Negative for dizziness.  Hematological: Negative for adenopathy.  Psychiatric/Behavioral: Negative for behavioral problems.   All other systems reviewed and are negative.     Allergies  Amoxicillin and Promethazine hcl  Home Medications   Prior to Admission medications   Medication Sig Start Date End Date Taking? Authorizing Provider  levothyroxine (SYNTHROID, LEVOTHROID) 75 MCG tablet Take 75 mcg by mouth daily. 01/19/15   Historical Provider, MD   BP 141/54 mmHg  Pulse 73  Temp(Src) 98.4 F (36.9 C) (Oral)  Resp 20  SpO2 98% Physical Exam  Constitutional: She is oriented to person, place, and time. She appears well-developed and well-nourished.  HENT:  Head: Normocephalic.  Mouth/Throat: No oropharyngeal exudate.  Dry oral mucous membranes  Eyes: Conjunctivae and EOM are normal. Pupils are equal, round, and reactive to light.  Neck: Normal range of motion. Neck supple.  Cardiovascular: Normal rate, regular rhythm, normal heart sounds and intact distal pulses.  Exam reveals no gallop and no friction rub.   No murmur heard. Pulmonary/Chest: Effort normal and breath sounds normal. No respiratory distress. She has no wheezes.  Abdominal: Soft. Bowel sounds are normal. There is no tenderness. There is no rebound and no guarding.  Musculoskeletal: Normal range of motion. She exhibits no edema or tenderness.  Neurological: She is alert and oriented to person, place, and time.  alert, oriented x3 speech: normal in context and clarity memory: intact grossly cranial nerves II-XII: intact motor strength: full proximally and distally no involuntary movements or tremors sensation: intact to light touch diffusely  cerebellar: finger-to-nose and heel-to-shin intact gait: normal  Skin: Skin is warm and dry.  Psychiatric: She has a normal mood and affect. Her behavior is normal.  Nursing note and vitals reviewed.  ED Course  Procedures (including critical care time) Labs Review Labs Reviewed  CBC WITH DIFFERENTIAL/PLATELET - Abnormal; Notable for the following:    HCT 35.6 (*)    Neutrophils Relative  % 79 (*)    Neutro Abs 7.8 (*)    All other components within normal limits  COMPREHENSIVE METABOLIC PANEL - Abnormal; Notable for the following:    CO2 21 (*)    Glucose, Bld 129 (*)    All other components within normal limits  URINALYSIS, ROUTINE W REFLEX MICROSCOPIC (NOT AT [facility]) - Abnormal; Notable for the following:    Ketones, ur >80 (*)    All other components within normal limits  I-STAT TROPOININ, ED  POC URINE PREG, ED    Imaging Review Dg Chest 2 View  02/07/2015   CLINICAL DATA:  Shortness of breath. Dehydration, nausea, vomiting, and chills after doing yd work today. Former smoker.  EXAM: CHEST  2 VIEW  COMPARISON:  02/15/2008  FINDINGS: The cardiac silhouette is upper limits of normal in size. The lungs are well inflated with minimal scarring or atelectasis in the left lung base. No confluent airspace opacity, pulmonary edema, pleural effusion, or pneumothorax is identified. No acute osseous abnormality is seen.  IMPRESSION: No active cardiopulmonary disease.   Electronically Signed   By: Sebastian Ache   On: 02/07/2015 16:52     EKG Interpretation   Date/Time:  Sunday February 07 2015 15:57:49 EDT Ventricular Rate:  68 PR Interval:  159 QRS Duration: 80 QT Interval:  404 QTC Calculation: 430 R Axis:   64 Text Interpretation:  Sinus rhythm Low voltage, precordial leads  Borderline T abnormalities, anterior leads No significant change since  last tracing Confirmed by Walden Statz  MD, Patrece Tallie ([id number]) on 02/07/2015  4:06:04 PM      MDM   Final diagnoses:  SOB (shortness of breath)  Heat exhaustion, initial encounter    4:01 PM 49 y.o. female who presents with multiple complaints after working in the heat. She states that she was pushing a lawnmower for about one hour out in the heat and became very sweaty, tired, short of breath, nausea, vomiting, diarrhea and had some tingling in her chest. She denies any frank chest pain. She states that she developed a gradual onset  frontal headache which was initially 9 out of 10 but has spontaneously decreased to 5 out of 10. She states that she feels very lightheaded on exam. She drinks a glass of water and took a cold shower at home without significant relief. Her vital signs are unremarkable here. Normal neurologic exam although gait initially deferred due to lightheadedness. Likely heat exhaustion, will get screening labs and imaging. Will hydrate and tx headache as well.  6:42 PM: I interpreted/reviewed the labs and/or imaging which were non-contributory. [name] HA resolving. Pt continues to appear well and is feeling much better. Sx likely heat related. I have discussed the diagnosis/risks/treatment options with the patient and family and believe the pt to be eligible for discharge home to follow-up with her pcp as needed. We also discussed returning to the ED immediately if new or worsening sx occur. We discussed the sx which are most concerning (e.g., worsening HA, syncope, cp, sob) that necessitate immediate return. Medications administered to the patient during their visit and any new prescriptions provided to the patient are listed below.  Medications given during this visit Medications  sodium chloride 0.9 % bolus 1,000 mL (0 mLs Intravenous Stopped 02/07/15 1840)  metoCLOPramide (REGLAN) injection  5 mg (5 mg Intravenous Given 02/07/15 1615)  diphenhydrAMINE (BENADRYL) injection 12.5 mg (12.5 mg Intravenous Given 02/07/15 1615)  acetaminophen (TYLENOL) tablet 650 mg (650 mg Oral Given 02/07/15 1615)    New Prescriptions   No medications on file     Purvis Sheffield, MD 02/07/15 1843

## 2015-02-07 NOTE — ED Notes (Signed)
Patient here with complaints of heat exhaustion. States that she was in the sun from 12:30pm -2:00pm. "Feels like im fighting to stay awake."  Nausea, vomiting, headache.

## 2015-02-07 NOTE — ED Notes (Signed)
Patient ambulated x2 to bathroom. Gait steady. AAO x4.

## 2015-02-17 ENCOUNTER — Other Ambulatory Visit: Payer: Self-pay | Admitting: Obstetrics and Gynecology

## 2015-02-19 LAB — CYTOLOGY - PAP

## 2015-08-15 ENCOUNTER — Ambulatory Visit (INDEPENDENT_AMBULATORY_CARE_PROVIDER_SITE_OTHER): Payer: BLUE CROSS/BLUE SHIELD | Admitting: Urgent Care

## 2015-08-15 VITALS — BP 124/80 | HR 71 | Temp 98.8°F | Resp 17 | Ht 61.42 in | Wt 187.0 lb

## 2015-08-15 DIAGNOSIS — R300 Dysuria: Secondary | ICD-10-CM | POA: Diagnosis not present

## 2015-08-15 DIAGNOSIS — N3090 Cystitis, unspecified without hematuria: Secondary | ICD-10-CM | POA: Diagnosis not present

## 2015-08-15 DIAGNOSIS — R350 Frequency of micturition: Secondary | ICD-10-CM

## 2015-08-15 LAB — POCT URINALYSIS DIP (MANUAL ENTRY)
BILIRUBIN UA: NEGATIVE
Glucose, UA: NEGATIVE
Ketones, POC UA: NEGATIVE
NITRITE UA: NEGATIVE
PH UA: 5.5
PROTEIN UA: NEGATIVE
Spec Grav, UA: <=1.005
Urobilinogen, UA: 0.2

## 2015-08-15 LAB — POC MICROSCOPIC URINALYSIS (UMFC): MUCUS RE: ABSENT

## 2015-08-15 NOTE — Patient Instructions (Signed)

## 2015-08-15 NOTE — Progress Notes (Signed)
    MRN: [id number] DOB: [birth year]  Subjective:   [name] is a 50 y.o. female presenting for chief complaint of Dysuria  Reports onset of dysuria that started today, urinary frequency, urinary urgency, urinary incontinence, cloudy urine. Patient has a history of ~12 UTIs in her history. This feels very similar. Denies fever, n/v, abdominal pain, pelvic pain, vaginal discharge, genital rash. Denies smoking cigarettes.  [name] has a current medication list which includes the following prescription(s): citalopram hydrobromide, ibuprofen, and levothyroxine. Also is allergic to promethazine hcl.  [name]  has a past medical history of Anxiety and Tachycardia. Also  has past surgical history that includes Cesarean section and myomectory.  Her family history includes Cancer in her father and maternal grandmother; Diabetes in her father and paternal grandfather; Heart disease in her father, paternal grandfather, and paternal grandmother; Hyperlipidemia in her father and paternal grandfather; Hypertension in her paternal grandfather and paternal grandmother.  Objective:   Vitals: BP 124/80 mmHg  Pulse 71  Temp(Src) 98.8 F (37.1 C) (Oral)  Resp 17  Ht 5' 1.42" (1.56 m)  Wt 187 lb (84.823 kg)  BMI 34.85 kg/m2  SpO2 98%  LMP 08/15/2015  Physical Exam  Constitutional: She is oriented to person, place, and time. She appears well-developed and well-nourished.  Cardiovascular: Normal rate, regular rhythm and intact distal pulses.  Exam reveals no gallop and no friction rub.   No murmur heard. Pulmonary/Chest: No respiratory distress. She has no wheezes. She has no rales.  Abdominal: Soft. Bowel sounds are normal. She exhibits no distension and no mass. There is no tenderness.  No CVA tenderness.  Neurological: She is alert and oriented to person, place, and time.  Skin: Skin is warm and dry.   Results for orders placed or performed in visit on 08/15/15 (from the past 24 hour(s))  POCT  urinalysis dipstick     Status: Abnormal   Collection Time: 08/15/15 12:12 PM  Result Value Ref Range   Color, UA colorless (A) yellow   Clarity, UA clear clear   Glucose, UA negative negative   Bilirubin, UA negative negative   Ketones, POC UA negative negative   Spec Grav, UA <=1.005    Blood, UA moderate (A) negative   pH, UA 5.5    Protein Ur, POC negative negative   Urobilinogen, UA 0.2    Nitrite, UA Negative Negative   Leukocytes, UA Trace (A) Negative  POCT Microscopic Urinalysis ([facility])     Status: Abnormal   Collection Time: 08/15/15 12:13 PM  Result Value Ref Range   WBC,UR,HPF,POC None None WBC/hpf   RBC,UR,HPF,POC Few (A) None RBC/hpf   Bacteria None None, Too numerous to count   Mucus Absent Absent   Epithelial Cells, UR Per Microscopy None None, Too numerous to count cells/hpf   Assessment and Plan :   1. Dysuria 2. Urinary frequency 3. Cystitis - Start cipro for coverage of UTI. Urine culture pending. RTC in 1 week if no improvement or worsening symptoms.  Wallis Bamberg, PA-C [facility] [phone] 08/15/2015 11:49 AM

## 2015-08-17 LAB — URINE CULTURE: Colony Count: 45000

## 2015-08-19 ENCOUNTER — Encounter: Payer: Self-pay | Admitting: Urgent Care

## 2015-10-16 DIAGNOSIS — M9902 Segmental and somatic dysfunction of thoracic region: Secondary | ICD-10-CM | POA: Diagnosis not present

## 2015-10-16 DIAGNOSIS — M531 Cervicobrachial syndrome: Secondary | ICD-10-CM | POA: Diagnosis not present

## 2015-10-16 DIAGNOSIS — R51 Headache: Secondary | ICD-10-CM | POA: Diagnosis not present

## 2015-10-16 DIAGNOSIS — M9901 Segmental and somatic dysfunction of cervical region: Secondary | ICD-10-CM | POA: Diagnosis not present

## 2015-10-18 ENCOUNTER — Telehealth: Payer: Self-pay | Admitting: Cardiovascular Disease

## 2015-10-18 NOTE — Telephone Encounter (Signed)
Received records from [facility] for appointment on 10/19/15 with Dr Duke Salviaandolph.  [facility] Dr Leonides Sakeandolph's schedule on 10/19/15.

## 2015-10-19 ENCOUNTER — Encounter: Payer: Self-pay | Admitting: Cardiovascular Disease

## 2015-10-19 ENCOUNTER — Ambulatory Visit (INDEPENDENT_AMBULATORY_CARE_PROVIDER_SITE_OTHER): Payer: BLUE CROSS/BLUE SHIELD | Admitting: Cardiovascular Disease

## 2015-10-19 VITALS — BP 130/80 | HR 56 | Ht 61.0 in | Wt 185.2 lb

## 2015-10-19 DIAGNOSIS — I471 Supraventricular tachycardia, unspecified: Secondary | ICD-10-CM | POA: Insufficient documentation

## 2015-10-19 DIAGNOSIS — R600 Localized edema: Secondary | ICD-10-CM

## 2015-10-19 HISTORY — DX: Supraventricular tachycardia, unspecified: I47.10

## 2015-10-19 HISTORY — DX: Supraventricular tachycardia: I47.1

## 2015-10-19 NOTE — Progress Notes (Signed)
Cardiology Office Note   Date:  10/19/2015   ID:  [name], DOB 06/23/1966, MRN [id number]  PCP:  Monica Heron, MD  Cardiologist:   Monica Hook, MD   Chief Complaint  Patient presents with  . New Patient (Initial Visit)    SVT, sob, le edema,       History of Present Illness: [name] is a 50 y.o. female with hypothyroidism, anxiety and SVT who presents for an evaluation of palpitations.  She saw her PCP, Dr. Beverley Fiedler on 09/1015 and reported intermittent palpitations.  She reports having palpitations for her whole life.  Five years ago she was diagnosed with SVT and briefly took atenolol.  However she stopped taking this when she became pregnant.  The palpitations were well controlled until a few months ago when she started noticing them again. The episodes typically last for several seconds until she coughs or does a Valsalva maneuver, which typically stops it. She reported these symptoms to her primary care physician who started her back on atenolol. Since that time the episodes are much better controlled. There is no associated nausea or omiting, but she did experience sharp chest pain, diaphoresis and mild lightheadedness.  Her thyroid was tested a few months ago and was reportedly within normal limits.  [name] also reports lower extremity edema and pressure in her head when she bends over.  This has been ongoing for months.  The swelling improved after the chiropractor adjusted her ankles.  The edema does not improve with elevation of her legs.  She limits here fluid intake and drinks 100-130 oz of fluid daily.  She denies orthopnea but notes that she sometimes wakes herself up because seh isn't breathing.  This is a new symptom which she attributes to weight gain. She is been told that she snores.  [name] wakes up feeling tired and feels fatigued throughout the day.  [name] recently started exercising daily for 30-45 minutes. She does cardio and  weight. She denies chest pain or shortness of breath with this activity.  Past Medical History  Diagnosis Date  . Anxiety   . Tachycardia     Past Surgical History  Procedure Laterality Date  . Cesarean section    . Myomectory       Current Outpatient Prescriptions  Medication Sig Dispense Refill  . atenolol (TENORMIN) 25 MG tablet Take 25 mg by mouth daily.    . Cholecalciferol (VITAMIN D) 2000 units CAPS Take 2,000 Units by mouth daily.    . citalopram (CELEXA) 40 MG tablet Take 40 mg by mouth daily.  1  . Homeopathic Products (REBOOST IMMUNE SUPPORT PO) Take 1 Dose by mouth daily.    [name] ibuprofen (ADVIL,MOTRIN) 200 MG tablet Take 200-400 mg by mouth every 6 (six) hours as needed for headache or moderate pain.    [name] levothyroxine (SYNTHROID, LEVOTHROID) 75 MCG tablet Take 75 mcg by mouth daily.  0  . MILK THISTLE PO Take 600 mg by mouth daily.    . Multiple Vitamin (MULTIVITAMIN) tablet Take 1 tablet by mouth daily.    . Omega-3 Fatty Acids (FISH OIL) 1000 MG CAPS Take 1 capsule by mouth daily.    . Probiotic Product (PROBIOTIC DAILY PO) Take 1 capsule by mouth daily.    . TURMERIC PO Take 1 capsule by mouth daily.     No current facility-administered medications for this visit.    Allergies:   Promethazine hcl  Social History:  The patient  reports that she has never smoked. She does not have any smokeless tobacco history on file. She reports that she does not drink alcohol or use illicit drugs.   Family History:  The patient's family history includes Cancer in her father and maternal grandmother; Diabetes in her father and paternal grandfather; Heart disease in her father, paternal grandfather, and paternal grandmother; Hyperlipidemia in her father and paternal grandfather; Hypertension in her paternal grandfather and paternal grandmother.    ROS:  Please see the history of present illness.   Otherwise, review of systems are positive for cold symptoms.   All other systems  are reviewed and negative.    PHYSICAL EXAM: VS:  BP 130/80 mmHg  Pulse 56  Ht  (1.549 m)  Wt 84.006 kg (185 lb 3.2 oz)  BMI 35.01 kg/m2 , BMI Body mass index is 35.01 kg/(m^2). GENERAL:  Well appearing HEENT:  Pupils equal round and reactive, fundi not visualized, oral mucosa unremarkable NECK:  No jugular venous distention, waveform within normal limits, carotid upstroke brisk and symmetric, no bruits, no thyromegaly LYMPHATICS:  No cervical adenopathy LUNGS:  Clear to auscultation bilaterally HEART:  RRR.  PMI not displaced or sustained,S1 and S2 within normal limits, no S3, no S4, no clicks, no rubs, no murmurs ABD:  Flat, positive bowel sounds normal in frequency in pitch, no bruits, no rebound, no guarding, no midline pulsatile mass, no hepatomegaly, no splenomegaly EXT:  2 plus pulses throughout, trace edema, no cyanosis no clubbing SKIN:  No rashes no nodules NEURO:  Cranial nerves II through XII grossly intact, motor grossly intact throughout PSYCH:  Cognitively intact, oriented to person place and time   EKG:  EKG is ordered today. The ekg ordered today demonstrates sinus bradycardia. Rate 56 bpm.   Recent Labs: 02/07/2015: ALT 18; BUN 16; Creatinine, Ser 0.78; Hemoglobin 12.3; Platelets 187; Potassium 4.1; Sodium 138    Lipid Panel    Component Value Date/Time   CHOL  02/16/2008 0400    151        ATP III CLASSIFICATION:  <200     mg/dL   Desirable  161-096  mg/dL   Borderline High  >=045    mg/dL   High   TRIG 67 40/98/1191 0400   HDL 35* 02/16/2008 0400   CHOLHDL 4.3 02/16/2008 0400   VLDL 13 02/16/2008 0400   LDLCALC * 02/16/2008 0400    103        Total Cholesterol/HDL:CHD Risk Coronary Heart Disease Risk Table                     Men   Women  1/2 Average Risk   3.4   3.3      Wt Readings from Last 3 Encounters:  10/19/15 84.006 kg (185 lb 3.2 oz)  08/15/15 84.823 kg (187 lb)  04/22/08 76.204 kg (168 lb)      ASSESSMENT AND PLAN:  # SVT:  Symptoms are better controlled on atenolol. We discussed the fact that this may be hormonally driven for her. She is currently perimenopausal. The other times in her life when the symptoms were severe were when she was 18 and when she was pregnant. We will not make any changes for now. Bradycardia we'll likely prohibit the titration of beta blockers if the symptoms recur.  # LE edema: I suspect that her edema is not related to heart failure. She does not appear volume overloaded on  exam. We will obtain an echo to evaluate for structural heart disease.    # Daytime somnolence, snoring: We will obtain a sleep study to evaluate for obstructive sleep apnea.  Her Epworth sleepiness scale is 8.    Current medicines are reviewed at length with the patient today.  The patient does not have concerns regarding medicines.  The following changes have been made:  no change  Labs/ tests ordered today include:   Orders Placed This Encounter  Procedures  . EKG 12-Lead  . ECHOCARDIOGRAM COMPLETE     Disposition:   FU with Nashia Remus C. Duke Salviaandolph, MD, [facility] in 3 months.   This note was written with the assistance of speech recognition software.  Please excuse any transcriptional errors.  Signed, Dayshon Roback C. Duke Salviaandolph, MD, [facility]  10/19/2015 11:45 PM    [facility]

## 2015-10-19 NOTE — Patient Instructions (Signed)
Medication Instructions Your physician recommends that you continue on your current medications as directed. Please refer to the Current Medication list given to you today.  Labwork: None  Testing/Procedures: Your physician has requested that you have an echocardiogram. Echocardiography is a painless test that uses sound waves to create images of your heart. It provides your doctor with information about the size and shape of your heart and how well your heart's chambers and valves are working. This procedure takes approximately one hour. There are no restrictions for this procedure.  Follow-Up: Your physician recommends that you schedule a follow-up appointment in: 3 months  Any Other Special Instructions Will Be Listed Below (If Applicable).  If you need a refill on your cardiac medications before your next appointment, please call your pharmacy.  

## 2015-10-21 ENCOUNTER — Telehealth: Payer: Self-pay | Admitting: *Deleted

## 2015-10-21 ENCOUNTER — Encounter: Payer: Self-pay | Admitting: Cardiovascular Disease

## 2015-10-21 DIAGNOSIS — R400 Somnolence: Secondary | ICD-10-CM

## 2015-10-21 DIAGNOSIS — R0683 Snoring: Secondary | ICD-10-CM

## 2015-10-21 NOTE — Telephone Encounter (Signed)
Patient scheduled for sleep study.

## 2015-10-21 NOTE — Telephone Encounter (Signed)
After visit with Dr Duke Salviaandolph she decided to order sleep studay Advised [facility]

## 2015-11-05 ENCOUNTER — Ambulatory Visit (HOSPITAL_COMMUNITY): Payer: BLUE CROSS/BLUE SHIELD

## 2015-11-05 ENCOUNTER — Ambulatory Visit (HOSPITAL_COMMUNITY)
Admission: RE | Admit: 2015-11-05 | Discharge: 2015-11-05 | Disposition: A | Discharge status: Home / Self Care | Payer: BLUE CROSS/BLUE SHIELD | Source: Ambulatory Visit | Attending: Cardiovascular Disease | Admitting: Cardiovascular Disease

## 2015-11-05 DIAGNOSIS — R600 Localized edema: Secondary | ICD-10-CM | POA: Diagnosis not present

## 2015-11-05 DIAGNOSIS — I340 Nonrheumatic mitral (valve) insufficiency: Secondary | ICD-10-CM | POA: Diagnosis not present

## 2015-11-05 DIAGNOSIS — I071 Rheumatic tricuspid insufficiency: Secondary | ICD-10-CM | POA: Diagnosis not present

## 2015-11-05 DIAGNOSIS — I471 Supraventricular tachycardia: Secondary | ICD-10-CM | POA: Diagnosis not present

## 2015-12-06 DIAGNOSIS — M9901 Segmental and somatic dysfunction of cervical region: Secondary | ICD-10-CM | POA: Diagnosis not present

## 2015-12-06 DIAGNOSIS — M9902 Segmental and somatic dysfunction of thoracic region: Secondary | ICD-10-CM | POA: Diagnosis not present

## 2015-12-06 DIAGNOSIS — R51 Headache: Secondary | ICD-10-CM | POA: Diagnosis not present

## 2015-12-06 DIAGNOSIS — M531 Cervicobrachial syndrome: Secondary | ICD-10-CM | POA: Diagnosis not present

## 2015-12-15 ENCOUNTER — Encounter (HOSPITAL_BASED_OUTPATIENT_CLINIC_OR_DEPARTMENT_OTHER): Payer: BLUE CROSS/BLUE SHIELD

## 2015-12-31 DIAGNOSIS — M9901 Segmental and somatic dysfunction of cervical region: Secondary | ICD-10-CM | POA: Diagnosis not present

## 2015-12-31 DIAGNOSIS — R51 Headache: Secondary | ICD-10-CM | POA: Diagnosis not present

## 2015-12-31 DIAGNOSIS — M531 Cervicobrachial syndrome: Secondary | ICD-10-CM | POA: Diagnosis not present

## 2015-12-31 DIAGNOSIS — M9902 Segmental and somatic dysfunction of thoracic region: Secondary | ICD-10-CM | POA: Diagnosis not present

## 2016-01-28 ENCOUNTER — Telehealth: Payer: Self-pay | Admitting: *Deleted

## 2016-01-28 NOTE — Telephone Encounter (Signed)
Patient (Patient cancelled and states she will call back to reschedule at a later date. KB.)

## 2016-01-28 NOTE — Telephone Encounter (Signed)
-----   Message from Silver Huguenineedie C Creed sent at 10/21/2015  3:27 PM EDT ----- Done.[name].[name].May 31! ----- Message -----    From: Burnell BlanksMelinda B Pratt    Sent: 10/21/2015   1:03 PM      To: Sampson GoonShawnee I Trigloff, Deedie C Creed  Patient needs a sleep study Thanks [place]

## 2016-02-04 ENCOUNTER — Ambulatory Visit: Payer: BLUE CROSS/BLUE SHIELD | Admitting: Cardiovascular Disease

## 2016-03-30 DIAGNOSIS — Z1231 Encounter for screening mammogram for malignant neoplasm of breast: Secondary | ICD-10-CM | POA: Diagnosis not present

## 2016-03-31 DIAGNOSIS — Z6834 Body mass index (BMI) 34.0-34.9, adult: Secondary | ICD-10-CM | POA: Diagnosis not present

## 2016-03-31 DIAGNOSIS — Z1212 Encounter for screening for malignant neoplasm of rectum: Secondary | ICD-10-CM | POA: Diagnosis not present

## 2016-03-31 DIAGNOSIS — Z01419 Encounter for gynecological examination (general) (routine) without abnormal findings: Secondary | ICD-10-CM | POA: Diagnosis not present

## 2016-06-02 DIAGNOSIS — Z23 Encounter for immunization: Secondary | ICD-10-CM | POA: Diagnosis not present

## 2016-06-02 DIAGNOSIS — Z0000 Encounter for general adult medical examination without abnormal findings: Secondary | ICD-10-CM | POA: Diagnosis not present

## 2016-06-02 DIAGNOSIS — R5383 Other fatigue: Secondary | ICD-10-CM | POA: Diagnosis not present

## 2016-06-02 DIAGNOSIS — E559 Vitamin D deficiency, unspecified: Secondary | ICD-10-CM | POA: Diagnosis not present

## 2016-06-02 DIAGNOSIS — E039 Hypothyroidism, unspecified: Secondary | ICD-10-CM | POA: Diagnosis not present

## 2016-06-02 DIAGNOSIS — R002 Palpitations: Secondary | ICD-10-CM | POA: Diagnosis not present

## 2016-06-02 DIAGNOSIS — F418 Other specified anxiety disorders: Secondary | ICD-10-CM | POA: Diagnosis not present

## 2016-06-27 DIAGNOSIS — G4733 Obstructive sleep apnea (adult) (pediatric): Secondary | ICD-10-CM | POA: Diagnosis not present

## 2016-06-28 DIAGNOSIS — G4733 Obstructive sleep apnea (adult) (pediatric): Secondary | ICD-10-CM | POA: Diagnosis not present

## 2016-07-05 ENCOUNTER — Telehealth: Payer: Self-pay | Admitting: Cardiovascular Disease

## 2016-07-05 NOTE — Telephone Encounter (Signed)
Returned call to patient. She states she is "throwing a few extra heart beats" - at rest or with activity. About 2 weeks ago, patient began to feel atenolol was ineffective for her. She feels fine - no stress, anxiety. She states this is her SVT. She states she does not monitor her BP or HR when these episodes occur.   Informed her that since she has not seen Dr. Duke Salviaandolph since 10/2015 (was supposed to followup July 2017) she may need to be seen in the office for eval/med adjustment. She voiced understanding.   Will route to MD

## 2016-07-05 NOTE — Telephone Encounter (Signed)
I agree.  Lets schedule follow up with me or one of our APPs to discuss.

## 2016-07-05 NOTE — Telephone Encounter (Signed)
Pt says she have been on Atenolol,but the problem seem to be getting worse.Please call to advise.

## 2016-07-06 NOTE — Telephone Encounter (Signed)
Scheduled follow up with Monica PaschalLuke K PA Patient aware of date and time

## 2016-07-11 ENCOUNTER — Encounter (INDEPENDENT_AMBULATORY_CARE_PROVIDER_SITE_OTHER): Payer: Self-pay

## 2016-07-11 ENCOUNTER — Ambulatory Visit (INDEPENDENT_AMBULATORY_CARE_PROVIDER_SITE_OTHER): Payer: BLUE CROSS/BLUE SHIELD | Admitting: Cardiology

## 2016-07-11 ENCOUNTER — Encounter: Payer: Self-pay | Admitting: Cardiology

## 2016-07-11 VITALS — BP 120/78 | HR 59 | Ht 61.0 in | Wt 193.0 lb

## 2016-07-11 DIAGNOSIS — E669 Obesity, unspecified: Secondary | ICD-10-CM

## 2016-07-11 DIAGNOSIS — R002 Palpitations: Secondary | ICD-10-CM

## 2016-07-11 DIAGNOSIS — Z8249 Family history of ischemic heart disease and other diseases of the circulatory system: Secondary | ICD-10-CM | POA: Diagnosis not present

## 2016-07-11 DIAGNOSIS — E039 Hypothyroidism, unspecified: Secondary | ICD-10-CM | POA: Diagnosis not present

## 2016-07-11 MED ORDER — METOPROLOL TARTRATE 25 MG PO TABS: 12.5000 mg | ORAL_TABLET | Freq: Two times a day (BID) | ORAL | 3 refills | Status: DC

## 2016-07-11 NOTE — Assessment & Plan Note (Signed)
Pt seen in the office today for increased palpitations.

## 2016-07-11 NOTE — Assessment & Plan Note (Signed)
Followed by PCP, recent TSH WNL per pt

## 2016-07-11 NOTE — Progress Notes (Signed)
07/11/2016 [name]   11/19/1965  [id number]  Primary Physician Clayborn HeronVictoria R Rankins, MD Primary Cardiologist: Dr Duke Salviaandolph  HPI:  Pleasant 50 y/o female with a questionable remote history of PSVT (no documentation) and a long history of palpitations. She was seen in 2009 after she presented to an Urgent Care with chest pain and an abnormal EKG. She ruled out for an MI. OP Treadmill was remarkable for PVCs and PACs. She did well after this till early 2017 when she noted increasing palpitations. She saw Dr Duke Salviarandolph and Atenolol was resumed, initially with good results. Echo April 2017 showed normal LVF, normal LA size. She is in the office now with complaints of increased palpitations. She describes an early beat, followed by a slight pause, then a hard beat.  She denies any sustained tachycardia. It is interesting to note that her palpations are mainly during the day and not at night or when she is quiet. She denies any syncope. She occasionally feels "light headed" when she has palpitations. She denies any exertional chest pain or dyspnea.    Current Outpatient Prescriptions  Medication Sig Dispense Refill  . Cholecalciferol (VITAMIN D) 2000 units CAPS Take 2,000 Units by mouth daily.    . citalopram (CELEXA) 40 MG tablet Take 40 mg by mouth daily.  1  . Homeopathic Products (REBOOST IMMUNE SUPPORT PO) Take 1 Dose by mouth daily.    [name]. ibuprofen (ADVIL,MOTRIN) 200 MG tablet Take 200-400 mg by mouth every 6 (six) hours as needed for headache or moderate pain.    [name]. levothyroxine (SYNTHROID, LEVOTHROID) 75 MCG tablet Take 75 mcg by mouth daily.  0  . MILK THISTLE PO Take 600 mg by mouth daily.    . Multiple Vitamin (MULTIVITAMIN) tablet Take 1 tablet by mouth daily.    . Omega-3 Fatty Acids (FISH OIL) 1000 MG CAPS Take 1 capsule by mouth daily.    . Probiotic Product (PROBIOTIC DAILY PO) Take 1 capsule by mouth daily.    . TURMERIC PO Take 1 capsule by mouth daily.    . metoprolol tartrate  (LOPRESSOR) 25 MG tablet Take 0.5 tablets (12.5 mg total) by mouth 2 (two) times daily. 90 tablet 3   No current facility-administered medications for this visit.     Allergies  Allergen Reactions  . Promethazine Hcl Nausea Only    Social History   Social History  . Marital status: Married    Spouse name: N/A  . Number of children: N/A  . Years of education: N/A   Occupational History  . Not on file.   Social History Main Topics  . Smoking status: Never Smoker  . Smokeless tobacco: Not on file  . Alcohol use No  . Drug use: No  . Sexual activity: No   Other Topics Concern  . Not on file   Social History Narrative  . No narrative on file     Review of Systems: General: negative for chills, fever, night sweats or weight changes.  Cardiovascular: negative for chest pain, dyspnea on exertion, edema, orthopnea, palpitations, paroxysmal nocturnal dyspnea or shortness of breath Dermatological: negative for rash Respiratory: negative for cough or wheezing Urologic: negative for hematuria Abdominal: negative for nausea, vomiting, diarrhea, bright red blood per rectum, melena, or hematemesis Neurologic: negative for visual changes, syncope, or dizziness All other systems reviewed and are otherwise negative except as noted above.    Blood pressure 120/78, pulse (!) 59, height 5\' 1"  (1.549 m), weight 193 lb (87.5  kg).  General appearance: alert, cooperative, no distress and moderately obese Neck: no carotid bruit and no JVD Lungs: clear to auscultation bilaterally Heart: regular rate and rhythm Extremities: extremities normal, atraumatic, no cyanosis or edema Skin: Skin color, texture, turgor normal. No rashes or lesions Neurologic: Grossly normal  EKG NSR, SB-59. [id number]  ASSESSMENT AND PLAN:   Palpitations Pt seen in the office today for increased palpitations.   Family history of coronary artery disease in father Father had an MI at age 50  Obesity (BMI  30-39.9) BMI 36. She tells me her sleep study only showed mild sleep apnea  Hypothyroidism Followed by PCP, recent TSH WNL per pt   PLAN  I reassured her I did not think she had and serious heart problems. I did suggest we try Lopressor 12.5 mg BID instead of Atenolol. If she has persistent palpitations during the day she can try 25 mg in am, and 12.5 mg at night. If she has any sustained tachycardia she'll need an event monitor and possibly another POET.   Corine ShelterLuke Ranell Finelli PA-C 07/11/2016 4:35 PM

## 2016-07-11 NOTE — Assessment & Plan Note (Signed)
BMI 36. She tells me her sleep study only showed mild sleep apnea

## 2016-07-11 NOTE — Patient Instructions (Signed)
Medication Instructions:   STOP ATENOLOL  START METOPROLOL TARTRATE 12.5 MG TWICE DAILY= 1/2 OF THE 25 MG TABLET TWICE DAILY MAY INCREASE TO 25 MG IN THE MORNING AND 12.5 MG IN THE EVENING IF INCREASED PALPITATIONS DURING THE DAY.  Follow-Up:  Your physician recommends that you schedule a follow-up appointment in: 3 MONTHS [facility]

## 2016-07-11 NOTE — Assessment & Plan Note (Signed)
Father had an MI at age 50

## 2016-07-24 DIAGNOSIS — Z1211 Encounter for screening for malignant neoplasm of colon: Secondary | ICD-10-CM | POA: Diagnosis not present

## 2016-07-24 DIAGNOSIS — F418 Other specified anxiety disorders: Secondary | ICD-10-CM | POA: Diagnosis not present

## 2016-08-09 DIAGNOSIS — R197 Diarrhea, unspecified: Secondary | ICD-10-CM | POA: Diagnosis not present

## 2016-08-09 DIAGNOSIS — Z1211 Encounter for screening for malignant neoplasm of colon: Secondary | ICD-10-CM | POA: Diagnosis not present

## 2016-08-22 DIAGNOSIS — K5730 Diverticulosis of large intestine without perforation or abscess without bleeding: Secondary | ICD-10-CM | POA: Diagnosis not present

## 2016-08-22 DIAGNOSIS — Z1211 Encounter for screening for malignant neoplasm of colon: Secondary | ICD-10-CM | POA: Diagnosis not present

## 2016-08-22 DIAGNOSIS — K635 Polyp of colon: Secondary | ICD-10-CM | POA: Diagnosis not present

## 2016-08-22 DIAGNOSIS — K6389 Other specified diseases of intestine: Secondary | ICD-10-CM | POA: Diagnosis not present

## 2016-08-22 DIAGNOSIS — D125 Benign neoplasm of sigmoid colon: Secondary | ICD-10-CM | POA: Diagnosis not present

## 2016-10-08 NOTE — Progress Notes (Signed)
Cardiology Office Note   Date:  10/09/2016   ID:  [name], DOB [birth year], MRN [id number]  PCP:  Clayborn Heron, MD  Cardiologist:   Chilton Si, MD   No chief complaint on file.    History of Present Illness: [name] is a 51 y.o. female with hypothyroidism, anxiety and palpitations who presents for follow up.  She saw her PCP, Dr. Beverley Smith on 09/2015 and reported intermittent palpitations. Five years ago she was diagnosed with SVT and briefly took atenolol.  It is unclear how this diagnosis was made. She stopped taking metoprolol when she became pregnant. The palpitations recurred in 2017 and her PCP restarted atenolol, which helped the palpitations.  She reported lower extremity edema and was referred for an echo 11/05/15 that showed LVEF 60-65% and mild mitral regurgitation but was otherwise normal.  Since her last appointment she saw Corine Shelter on 06/2016 and reported increased palpitations that sounded more like PVC/PACs than sustained arrhythmias.  Atenolol was switched to metoprolol.  She has been doing very well on metoprolol. She notes palpitations maybe once per week and they are not sustained. She has not had any chest pain or shortness of breath. She does note that she's gained 30 pounds in the last 6 months. She treats this to no longer exercising. She started to work on her diet.  Past Medical History:  Diagnosis Date  . Anxiety   . SVT (supraventricular tachycardia) (HCC) 10/19/2015  . Tachycardia     Past Surgical History:  Procedure Laterality Date  . CESAREAN SECTION    . myomectory       Current Outpatient Prescriptions  Medication Sig Dispense Refill  . Cholecalciferol (VITAMIN D) 2000 units CAPS Take 2,000 Units by mouth daily.    . citalopram (CELEXA) 40 MG tablet Take 40 mg by mouth daily.  1  . Homeopathic Products (REBOOST IMMUNE SUPPORT PO) Take 1 Dose by mouth daily.    [name] ibuprofen (ADVIL,MOTRIN) 200 MG tablet Take 200-400 mg by  mouth every 6 (six) hours as needed for headache or moderate pain.    [name] levothyroxine (SYNTHROID, LEVOTHROID) 75 MCG tablet Take 75 mcg by mouth daily.  0  . metoprolol tartrate (LOPRESSOR) 25 MG tablet Take 0.5 tablets (12.5 mg total) by mouth 2 (two) times daily. 90 tablet 3  . MILK THISTLE PO Take 600 mg by mouth daily.    . Multiple Vitamin (MULTIVITAMIN) tablet Take 1 tablet by mouth daily.    . Omega-3 Fatty Acids (FISH OIL) 1000 MG CAPS Take 1 capsule by mouth daily.    . Probiotic Product (PROBIOTIC DAILY PO) Take 1 capsule by mouth daily.    . TURMERIC PO Take 1 capsule by mouth daily.     No current facility-administered medications for this visit.     Allergies:   Promethazine hcl    Social History:  The patient  reports that she has never smoked. She has never used smokeless tobacco. She reports that she does not drink alcohol or use drugs.   Family History:  The patient's family history includes COPD in her mother; Cancer in her father, maternal grandfather, and maternal grandmother; Dementia in her maternal grandmother; Diabetes in her father and paternal grandfather; Heart disease in her father, paternal grandfather, and paternal grandmother; Hyperlipidemia in her father and paternal grandfather; Hypertension in her paternal grandfather and paternal grandmother; Liver disease in her maternal grandfather.    ROS:  Please see the history of  present illness.   Otherwise, review of systems are positive for cold symptoms.   All other systems are reviewed and negative.    PHYSICAL EXAM: VS:  BP 122/64   Pulse 60   Ht 5\' 1"  (1.549 m)   Wt 90.3 kg (199 lb)   BMI 37.60 kg/m  , BMI Body mass index is 37.6 kg/m. GENERAL:  Well appearing HEENT:  Pupils equal round and reactive, fundi not visualized, oral mucosa unremarkable NECK:  No jugular venous distention, waveform within normal limits, carotid upstroke brisk and symmetric, no bruits LYMPHATICS:  No cervical  adenopathy LUNGS:  Clear to auscultation bilaterally HEART:  RRR.  PMI not displaced or sustained,S1 and S2 within normal limits, no S3, no S4, no clicks, no rubs, no murmurs ABD:  Flat, positive bowel sounds normal in frequency in pitch, no bruits, no rebound, no guarding, no midline pulsatile mass, no hepatomegaly, no splenomegaly EXT:  2 plus pulses throughout, trace edema, no cyanosis no clubbing SKIN:  No rashes no nodules NEURO:  Cranial nerves II through XII grossly intact, motor grossly intact throughout PSYCH:  Cognitively intact, oriented to person place and time   EKG:  EKG is not ordered today. The ekg ordered 07/11/16 demonstrates sinus bradycardia. Rate 56 bpm.  Echo 11/05/15: Study Conclusions  - Left ventricle: The cavity size was normal. Systolic function was   normal. The estimated ejection fraction was in the range of 60%   to 65%. Wall motion was normal; there were no regional wall   motion abnormalities. Left ventricular diastolic function   parameters were normal. - Aortic valve: Trileaflet; normal thickness leaflets. There was no   regurgitation. - Aortic root: The aortic root was normal in size. - Mitral valve: Structurally normal valve. There was mild   regurgitation. - Left atrium: The atrium was normal in size. - Right ventricle: The cavity size was normal. Wall thickness was   normal. Systolic function was normal. - Right atrium: The atrium was normal in size. - Tricuspid valve: There was trivial regurgitation. - Pulmonary arteries: Systolic pressure was within the normal   range. - Inferior vena cava: The vessel was normal in size. - Pericardium, extracardiac: There was no pericardial effusion.   Recent Labs: No results found for requested labs within last 8760 hours.    Lipid Panel    Component Value Date/Time   CHOL  02/16/2008 0400    151        ATP III CLASSIFICATION:  <200     mg/dL   Desirable  409-811  mg/dL   Borderline High  >=914     mg/dL   High   TRIG 67 78/29/5621 0400   HDL 35 (L) 02/16/2008 0400   CHOLHDL 4.3 02/16/2008 0400   VLDL 13 02/16/2008 0400   LDLCALC (H) 02/16/2008 0400    103        Total Cholesterol/HDL:CHD Risk Coronary Heart Disease Risk Table                     Men   Women  1/2 Average Risk   3.4   3.3      Wt Readings from Last 3 Encounters:  10/09/16 90.3 kg (199 lb)  07/11/16 87.5 kg (193 lb)  10/19/15 84 kg (185 lb 3.2 oz)      ASSESSMENT AND PLAN:  # Palpitations: Symptoms are better controlled on metoprolol.  It is unclear how she was diagnosed with SVT. She did not  wear a monitor. It does sound more like her symptoms are consistent with PVCs or PACs. She is doing very well on metoprolol. No changes for now.  # Obesity: We discussed the importance of regular exercise and weight loss. She reports that her PCP recently checked her lipids.  Current medicines are reviewed at length with the patient today.  The patient does not have concerns regarding medicines.  The following changes have been made:  no change  Labs/ tests ordered today include:   No orders of the defined types were placed in this encounter.    Disposition:   FU with Vyncent Overby C. Duke Salviaandolph, MD, [facility] in 1 year.   This note was written with the assistance of speech recognition software.  Please excuse any transcriptional errors.  Signed, Allister Lessley C. Duke Salviaandolph, MD, [facility]  10/09/2016 9:35 AM    [facility]

## 2016-10-09 ENCOUNTER — Encounter: Payer: Self-pay | Admitting: Cardiovascular Disease

## 2016-10-09 ENCOUNTER — Ambulatory Visit (INDEPENDENT_AMBULATORY_CARE_PROVIDER_SITE_OTHER): Payer: BLUE CROSS/BLUE SHIELD | Admitting: Cardiovascular Disease

## 2016-10-09 VITALS — BP 122/64 | HR 60 | Ht 61.0 in | Wt 199.0 lb

## 2016-10-09 DIAGNOSIS — E669 Obesity, unspecified: Secondary | ICD-10-CM | POA: Diagnosis not present

## 2016-10-09 DIAGNOSIS — R002 Palpitations: Secondary | ICD-10-CM

## 2016-10-09 NOTE — Patient Instructions (Signed)
NO CHANGE WITH CURRENT MEDICATION     Your physician wants you to follow-up in 12 MONTH WITH DR Pelahatchie.You will receive a reminder letter in the mail two months in advance. If you don't receive a letter, please call our office to schedule the follow-up appointment.   If you need a refill on your cardiac medications before your next appointment, please call your pharmacy.

## 2016-10-31 DIAGNOSIS — R05 Cough: Secondary | ICD-10-CM | POA: Diagnosis not present

## 2016-11-07 ENCOUNTER — Other Ambulatory Visit: Payer: Self-pay

## 2016-11-07 DIAGNOSIS — R002 Palpitations: Secondary | ICD-10-CM

## 2016-11-07 MED ORDER — METOPROLOL TARTRATE 25 MG PO TABS: 12.5000 mg | ORAL_TABLET | Freq: Two times a day (BID) | ORAL | 3 refills | Status: DC

## 2016-11-08 ENCOUNTER — Telehealth: Payer: Self-pay | Admitting: Cardiovascular Disease

## 2016-11-08 DIAGNOSIS — R002 Palpitations: Secondary | ICD-10-CM

## 2016-11-08 MED ORDER — METOPROLOL TARTRATE 25 MG PO TABS: 37.5000 mg | ORAL_TABLET | Freq: Every day | ORAL | 3 refills | Status: DC

## 2016-11-08 NOTE — Telephone Encounter (Signed)
New message  Pt states this Rx is supposed to say 1.5 (one and a half) everyday. She says when it is sent to the pharmacy it is sent in as 1 tablet per day so it is sent in incorrectly.   *STAT* If patient is at the pharmacy, call can be transferred to refill team.   1. Which medications need to be refilled? (please list name of each medication and dose if known) metoprolol tartrate (LOPRESSOR) 25 MG tablet  2. Which pharmacy/location (including street and city if local pharmacy) is medication to be sent to? [place] [facility]  3. Do they need a 30 day or 90 day supply? 30 day

## 2016-11-08 NOTE — Telephone Encounter (Signed)
Returned the phone call to the patient. She stated that she needed a new prescription refilled for her Metoprolol. She has been taking 37.5 mg daily and not the 12.5 mg twice daily. She stated that she has been feeling good with no complaints of palpitations nor light headedness.  Per Dr. Duke Salvia it is okay to fill the medication as she has been taking it. [facility] [address] [facility]. Patient has been made aware.

## 2016-11-20 DIAGNOSIS — J309 Allergic rhinitis, unspecified: Secondary | ICD-10-CM | POA: Diagnosis not present

## 2016-11-20 DIAGNOSIS — R05 Cough: Secondary | ICD-10-CM | POA: Diagnosis not present

## 2016-11-20 DIAGNOSIS — R0982 Postnasal drip: Secondary | ICD-10-CM | POA: Diagnosis not present

## 2016-12-17 ENCOUNTER — Emergency Department (HOSPITAL_COMMUNITY)
Admission: EM | Admit: 2016-12-17 | Discharge: 2016-12-18 | Disposition: A | Discharge status: Home / Self Care | Payer: BLUE CROSS/BLUE SHIELD | Attending: Emergency Medicine | Admitting: Emergency Medicine

## 2016-12-17 ENCOUNTER — Encounter (HOSPITAL_COMMUNITY): Payer: Self-pay | Admitting: Emergency Medicine

## 2016-12-17 DIAGNOSIS — Y999 Unspecified external cause status: Secondary | ICD-10-CM | POA: Diagnosis not present

## 2016-12-17 DIAGNOSIS — Z79899 Other long term (current) drug therapy: Secondary | ICD-10-CM | POA: Diagnosis not present

## 2016-12-17 DIAGNOSIS — Y93H9 Activity, other involving exterior property and land maintenance, building and construction: Secondary | ICD-10-CM | POA: Diagnosis not present

## 2016-12-17 DIAGNOSIS — T673XXA Heat exhaustion, anhydrotic, initial encounter: Secondary | ICD-10-CM | POA: Diagnosis not present

## 2016-12-17 DIAGNOSIS — R109 Unspecified abdominal pain: Secondary | ICD-10-CM | POA: Diagnosis not present

## 2016-12-17 DIAGNOSIS — T679XXA Effect of heat and light, unspecified, initial encounter: Secondary | ICD-10-CM

## 2016-12-17 DIAGNOSIS — X30XXXA Exposure to excessive natural heat, initial encounter: Secondary | ICD-10-CM | POA: Insufficient documentation

## 2016-12-17 DIAGNOSIS — Y92017 Garden or yard in single-family (private) house as the place of occurrence of the external cause: Secondary | ICD-10-CM | POA: Insufficient documentation

## 2016-12-17 LAB — URINALYSIS, ROUTINE W REFLEX MICROSCOPIC
BILIRUBIN URINE: NEGATIVE
GLUCOSE, UA: NEGATIVE mg/dL
Hgb urine dipstick: NEGATIVE
KETONES UR: 20 mg/dL — AB
Leukocytes, UA: NEGATIVE
Nitrite: NEGATIVE
PH: 5 (ref 5.0–8.0)
Protein, ur: NEGATIVE mg/dL
Specific Gravity, Urine: 1.024 (ref 1.005–1.030)

## 2016-12-17 LAB — LIPASE, BLOOD: Lipase: 21 U/L (ref 11–51)

## 2016-12-17 LAB — COMPREHENSIVE METABOLIC PANEL
ALBUMIN: 4 g/dL (ref 3.5–5.0)
ALT: 15 U/L (ref 14–54)
AST: 20 U/L (ref 15–41)
Alkaline Phosphatase: 69 U/L (ref 38–126)
Anion gap: 8 (ref 5–15)
BILIRUBIN TOTAL: 0.5 mg/dL (ref 0.3–1.2)
BUN: 19 mg/dL (ref 6–20)
CHLORIDE: 105 mmol/L (ref 101–111)
CO2: 27 mmol/L (ref 22–32)
Calcium: 9.2 mg/dL (ref 8.9–10.3)
Creatinine, Ser: 0.75 mg/dL (ref 0.44–1.00)
GFR calc Af Amer: >60 mL/min (ref >60)
GFR calc non Af Amer: >60 mL/min (ref >60)
GLUCOSE: 123 mg/dL — AB (ref 65–99)
POTASSIUM: 3.5 mmol/L (ref 3.5–5.1)
Sodium: 140 mmol/L (ref 135–145)
Total Protein: 7.2 g/dL (ref 6.5–8.1)

## 2016-12-17 LAB — CBC
HEMATOCRIT: 35.7 % — AB (ref 36.0–46.0)
Hemoglobin: 12.3 g/dL (ref 12.0–15.0)
MCH: 28.7 pg (ref 26.0–34.0)
MCHC: 34.5 g/dL (ref 30.0–36.0)
MCV: 83.2 fL (ref 78.0–100.0)
Platelets: 195 10*3/uL (ref 150–400)
RBC: 4.29 MIL/uL (ref 3.87–5.11)
RDW: 13.7 % (ref 11.5–15.5)
WBC: 7.6 10*3/uL (ref 4.0–10.5)

## 2016-12-17 LAB — POC URINE PREG, ED: Preg Test, Ur: NEGATIVE

## 2016-12-17 LAB — CK: Total CK: 97 U/L (ref 38–234)

## 2016-12-17 MED ORDER — METOCLOPRAMIDE HCL 5 MG/ML IJ SOLN
10.0000 mg | Freq: Once | INTRAMUSCULAR | Status: AC
  Administered 2016-12-17: 10 mg via INTRAVENOUS
  Filled 2016-12-17: qty 2

## 2016-12-17 MED ORDER — ONDANSETRON 4 MG PO TBDP
4.0000 mg | ORAL_TABLET | Freq: Once | ORAL | Status: DC
  Filled 2016-12-17: qty 1

## 2016-12-17 MED ORDER — DIPHENHYDRAMINE HCL 50 MG/ML IJ SOLN
25.0000 mg | Freq: Once | INTRAMUSCULAR | Status: AC
  Administered 2016-12-17: 25 mg via INTRAVENOUS
  Filled 2016-12-17: qty 1

## 2016-12-17 MED ORDER — SODIUM CHLORIDE 0.9 % IV BOLUS (SEPSIS)
1000.0000 mL | Freq: Once | INTRAVENOUS | Status: AC
  Administered 2016-12-17: 1000 mL via INTRAVENOUS

## 2016-12-17 MED ORDER — ACETAMINOPHEN 500 MG PO TABS
1000.0000 mg | ORAL_TABLET | Freq: Once | ORAL | Status: AC
  Administered 2016-12-17: 1000 mg via ORAL
  Filled 2016-12-17: qty 2

## 2016-12-17 NOTE — ED Notes (Signed)
No respiratory or acute distress noted alert and oriented x 3 call light in reach no reaction to medication noted states pain 4/10 family at bedside.

## 2016-12-17 NOTE — ED Provider Notes (Signed)
[facility]-EMERGENCY DEPT Provider Note   CSN: [id number] Arrival date & time: 12/17/16  2151     History   Chief Complaint Chief Complaint  Patient presents with  . Abdominal Pain    HPI [name] is a 51 y.o. female.  Presents to the emergency department with chief complaint of heat exposure. She states that she was out mowing the lawn today and over worked herself. She reports associated nausea, vomiting, muscle cramps and headache. She states that this happened to her once before 2 years ago. She states that she has not been drinking very much water. She states that she has had some dark urine. She has not taken anything for her symptoms. Her headache is worsened when looking at the light.  She denies any numbness, weakness, or tingling.   The history is provided by the patient. No language interpreter was used.    Past Medical History:  Diagnosis Date  . Anxiety   . SVT (supraventricular tachycardia) (HCC) 10/19/2015  . Tachycardia     Patient Active Problem List   Diagnosis Date Noted  . Palpitations 07/11/2016  . Family history of coronary artery disease in father 07/11/2016  . Obesity (BMI 30-39.9) 07/11/2016  . Hypothyroidism 07/11/2016  . SINUSITIS- ACUTE-NOS 04/22/2008  . URI 04/22/2008  . DEPRESSION 01/27/2008  . ALLERGIC RHINITIS 01/27/2008  . HEADACHE 01/27/2008  . UTI'S, HX OF 01/27/2008    Past Surgical History:  Procedure Laterality Date  . CESAREAN SECTION    . myomectory      OB History    No data available       Home Medications    Prior to Admission medications   Medication Sig Start Date End Date Taking? Authorizing Provider  Cholecalciferol (VITAMIN D) 2000 units CAPS Take 2,000 Units by mouth daily.    [provider]  citalopram (CELEXA) 40 MG tablet Take 40 mg by mouth daily. 09/29/15   [provider]  Homeopathic Products (REBOOST IMMUNE SUPPORT PO) Take 1 Dose by mouth daily.    [provider]  ibuprofen  (ADVIL,MOTRIN) 200 MG tablet Take 200-400 mg by mouth every 6 (six) hours as needed for headache or moderate pain.    [provider]  levothyroxine (SYNTHROID, LEVOTHROID) 75 MCG tablet Take 75 mcg by mouth daily. 01/19/15   [provider]  metoprolol tartrate (LOPRESSOR) 25 MG tablet Take 1.5 tablets (37.5 mg total) by mouth daily. 11/08/16 02/06/17  Chilton Si, MD  MILK THISTLE PO Take 600 mg by mouth daily.    [provider]  Multiple Vitamin (MULTIVITAMIN) tablet Take 1 tablet by mouth daily.    [provider]  Omega-3 Fatty Acids (FISH OIL) 1000 MG CAPS Take 1 capsule by mouth daily.    [provider]  Probiotic Product (PROBIOTIC DAILY PO) Take 1 capsule by mouth daily.    [provider]  TURMERIC PO Take 1 capsule by mouth daily.    [provider]    Family History Family History  Problem Relation Age of Onset  . Cancer Father   . Diabetes Father   . Heart disease Father   . Hyperlipidemia Father   . Cancer Maternal Grandmother   . Dementia Maternal Grandmother   . Heart disease Paternal Grandmother   . Hypertension Paternal Grandmother   . Diabetes Paternal Grandfather   . Heart disease Paternal Grandfather   . Hyperlipidemia Paternal Grandfather   . Hypertension Paternal Grandfather   . COPD Mother   .  Liver disease Maternal Grandfather   . Cancer Maternal Grandfather     Social History Social History  Substance Use Topics  . Smoking status: Never Smoker  . Smokeless tobacco: Never Used  . Alcohol use No     Allergies   Promethazine hcl   Review of Systems Review of Systems  All other systems reviewed and are negative.    Physical Exam Updated Vital Signs BP (!) 161/74 (BP Location: Left Arm)   Pulse 68   Temp 97.6 F (36.4 C) (Oral)   Resp (!) 24   Ht 5\' 1"  (1.549 m)   Wt 88.9 kg (196 lb)   LMP 11/16/2016 (Approximate)   SpO2 100%   BMI 37.03 kg/m   Physical Exam    Constitutional: She is oriented to person, place, and time. She appears well-developed and well-nourished.  HENT:  Head: Normocephalic and atraumatic.  Right Ear: External ear normal.  Left Ear: External ear normal.  Eyes: Conjunctivae and EOM are normal. Pupils are equal, round, and reactive to light.  Neck: Normal range of motion. Neck supple.  No pain with neck flexion, no meningismus  Cardiovascular: Normal rate, regular rhythm and normal heart sounds.  Exam reveals no gallop and no friction rub.   No murmur heard. Pulmonary/Chest: Effort normal and breath sounds normal. No respiratory distress. She has no wheezes. She has no rales. She exhibits no tenderness.  Abdominal: Soft. Bowel sounds are normal. She exhibits no distension and no mass. There is no tenderness. There is no rebound and no guarding.  No focal abdominal tenderness, no RLQ tenderness or pain at McBurney's point, no RUQ tenderness or Murphy's sign, no left-sided abdominal tenderness, no fluid wave, or signs of peritonitis   Musculoskeletal: Normal range of motion. She exhibits no edema or tenderness.  Normal gait.  Neurological: She is alert and oriented to person, place, and time. She has normal reflexes.  CN 3-12 intact, normal finger to nose, no pronator drift, sensation and strength intact bilaterally.  Skin: Skin is warm and dry.  Psychiatric: She has a normal mood and affect. Her behavior is normal. Judgment and thought content normal.  Nursing note and vitals reviewed.    ED Treatments / Results  Labs (all labs ordered are listed, but only abnormal results are displayed) Labs Reviewed  COMPREHENSIVE METABOLIC PANEL - Abnormal; Notable for the following:       Result Value   Glucose, Bld 123 (*)    All other components within normal limits  CBC - Abnormal; Notable for the following:    HCT 35.7 (*)    All other components within normal limits  URINALYSIS, ROUTINE W REFLEX MICROSCOPIC - Abnormal; Notable  for the following:    APPearance HAZY (*)    Ketones, ur 20 (*)    All other components within normal limits  LIPASE, BLOOD  CK  POC URINE PREG, ED    EKG  EKG Interpretation None       Radiology No results found.  Procedures Procedures (including critical care time)  Medications Ordered in ED Medications  sodium chloride 0.9 % bolus 1,000 mL (not administered)  metoCLOPramide (REGLAN) injection 10 mg (not administered)  diphenhydrAMINE (BENADRYL) injection 25 mg (not administered)  acetaminophen (TYLENOL) tablet 1,000 mg (not administered)     Initial Impression / Assessment and Plan / ED Course  I have reviewed the triage vital signs and the nursing notes.  Pertinent labs & imaging results that were available during my care  of the patient were reviewed by me and considered in my medical decision making (see chart for details).     Patient with complaints of heat exposure after mowing the lawn today.  Reports feeling muscle cramps, n/v, and headache.  Reports decreased fluid intake today.  States that something similar happened to her a couple of years ago.  Will check labs, CK, UA, and will give fluids.  Will give some reglan and benadryl and reassess.  11:10 Patient states that she feels jittery after meds.  Likely reglan.  No rash, no throat swelling, no wheezing.  His reassuring. Patient's electrolytes are normal. CKs normal. No evidence of myoglobin in urine. She does have some ketones (20). Continue fluids. Anticipate discharge.  12:17 AM Felling much better. She states that she is symptom-free now. She states that she feels ready to go home. I have encouraged her to continue fluids at home. She understands and agrees the plan.  Return precautions given.  Final Clinical Impressions(s) / ED Diagnoses   Final diagnoses:  Heat exposure, initial encounter    New Prescriptions New Prescriptions   No medications on file     Felipa Furnace 12/18/16  0019    Benjiman Core, MD 12/18/16 2316

## 2016-12-17 NOTE — ED Triage Notes (Signed)
Pt reports having vomiting that began around 2100 and began having headache at 1900. Pt reports vomiting for 20 min and all contents of stomach.

## 2017-01-13 DIAGNOSIS — R05 Cough: Secondary | ICD-10-CM | POA: Diagnosis not present

## 2017-01-19 DIAGNOSIS — R05 Cough: Secondary | ICD-10-CM | POA: Diagnosis not present

## 2017-02-22 ENCOUNTER — Emergency Department (HOSPITAL_COMMUNITY)
Admission: EM | Admit: 2017-02-22 | Discharge: 2017-02-23 | Disposition: A | Discharge status: Home / Self Care | Payer: BLUE CROSS/BLUE SHIELD | Attending: Emergency Medicine | Admitting: Emergency Medicine

## 2017-02-22 ENCOUNTER — Encounter (HOSPITAL_COMMUNITY): Payer: Self-pay | Admitting: Emergency Medicine

## 2017-02-22 DIAGNOSIS — Z5321 Procedure and treatment not carried out due to patient leaving prior to being seen by health care provider: Secondary | ICD-10-CM | POA: Diagnosis not present

## 2017-02-22 DIAGNOSIS — R1110 Vomiting, unspecified: Secondary | ICD-10-CM | POA: Diagnosis not present

## 2017-02-22 DIAGNOSIS — R51 Headache: Secondary | ICD-10-CM | POA: Diagnosis not present

## 2017-02-22 LAB — BASIC METABOLIC PANEL
Anion gap: 10 (ref 5–15)
BUN: 18 mg/dL (ref 6–20)
CALCIUM: 9.5 mg/dL (ref 8.9–10.3)
CO2: 26 mmol/L (ref 22–32)
CREATININE: 0.78 mg/dL (ref 0.44–1.00)
Chloride: 104 mmol/L (ref 101–111)
GFR calc Af Amer: >60 mL/min (ref >60)
GLUCOSE: 115 mg/dL — AB (ref 65–99)
Potassium: 4.5 mmol/L (ref 3.5–5.1)
Sodium: 140 mmol/L (ref 135–145)

## 2017-02-22 LAB — CBC
HCT: 38.5 % (ref 36.0–46.0)
Hemoglobin: 13.4 g/dL (ref 12.0–15.0)
MCH: 28.7 pg (ref 26.0–34.0)
MCHC: 34.8 g/dL (ref 30.0–36.0)
MCV: 82.4 fL (ref 78.0–100.0)
PLATELETS: 242 10*3/uL (ref 150–400)
RBC: 4.67 MIL/uL (ref 3.87–5.11)
RDW: 13.8 % (ref 11.5–15.5)
WBC: 9.5 10*3/uL (ref 4.0–10.5)

## 2017-02-22 NOTE — ED Notes (Signed)
Called for pt room placement no response.

## 2017-02-22 NOTE — ED Triage Notes (Signed)
Pt complaint of emesis, headache, and chills post gardening today; denies other.

## 2017-02-23 NOTE — ED Notes (Signed)
Called  No response from lobby 

## 2017-03-22 DIAGNOSIS — M9902 Segmental and somatic dysfunction of thoracic region: Secondary | ICD-10-CM | POA: Diagnosis not present

## 2017-03-22 DIAGNOSIS — M531 Cervicobrachial syndrome: Secondary | ICD-10-CM | POA: Diagnosis not present

## 2017-03-22 DIAGNOSIS — M9901 Segmental and somatic dysfunction of cervical region: Secondary | ICD-10-CM | POA: Diagnosis not present

## 2017-03-22 DIAGNOSIS — R51 Headache: Secondary | ICD-10-CM | POA: Diagnosis not present

## 2017-04-26 DIAGNOSIS — Z1231 Encounter for screening mammogram for malignant neoplasm of breast: Secondary | ICD-10-CM | POA: Diagnosis not present

## 2017-04-26 DIAGNOSIS — Z6837 Body mass index (BMI) 37.0-37.9, adult: Secondary | ICD-10-CM | POA: Diagnosis not present

## 2017-04-26 DIAGNOSIS — Z01419 Encounter for gynecological examination (general) (routine) without abnormal findings: Secondary | ICD-10-CM | POA: Diagnosis not present

## 2017-05-11 DIAGNOSIS — R0602 Shortness of breath: Secondary | ICD-10-CM | POA: Diagnosis not present

## 2017-05-11 DIAGNOSIS — R05 Cough: Secondary | ICD-10-CM | POA: Diagnosis not present

## 2017-05-11 DIAGNOSIS — K219 Gastro-esophageal reflux disease without esophagitis: Secondary | ICD-10-CM | POA: Diagnosis not present

## 2017-05-11 DIAGNOSIS — H6503 Acute serous otitis media, bilateral: Secondary | ICD-10-CM | POA: Diagnosis not present

## 2017-06-05 DIAGNOSIS — E039 Hypothyroidism, unspecified: Secondary | ICD-10-CM | POA: Diagnosis not present

## 2017-06-05 DIAGNOSIS — E559 Vitamin D deficiency, unspecified: Secondary | ICD-10-CM | POA: Diagnosis not present

## 2017-06-05 DIAGNOSIS — Z136 Encounter for screening for cardiovascular disorders: Secondary | ICD-10-CM | POA: Diagnosis not present

## 2017-06-11 DIAGNOSIS — Z23 Encounter for immunization: Secondary | ICD-10-CM | POA: Diagnosis not present

## 2017-06-11 DIAGNOSIS — Z0000 Encounter for general adult medical examination without abnormal findings: Secondary | ICD-10-CM | POA: Diagnosis not present

## 2017-07-23 DIAGNOSIS — R51 Headache: Secondary | ICD-10-CM | POA: Diagnosis not present

## 2017-07-23 DIAGNOSIS — M531 Cervicobrachial syndrome: Secondary | ICD-10-CM | POA: Diagnosis not present

## 2017-07-23 DIAGNOSIS — M9902 Segmental and somatic dysfunction of thoracic region: Secondary | ICD-10-CM | POA: Diagnosis not present

## 2017-07-23 DIAGNOSIS — M9901 Segmental and somatic dysfunction of cervical region: Secondary | ICD-10-CM | POA: Diagnosis not present

## 2017-08-06 ENCOUNTER — Other Ambulatory Visit: Payer: Self-pay | Admitting: Cardiology

## 2017-08-06 DIAGNOSIS — R002 Palpitations: Secondary | ICD-10-CM

## 2017-08-08 ENCOUNTER — Telehealth: Payer: Self-pay | Admitting: Cardiovascular Disease

## 2017-08-08 ENCOUNTER — Other Ambulatory Visit: Payer: Self-pay

## 2017-08-08 DIAGNOSIS — R002 Palpitations: Secondary | ICD-10-CM

## 2017-08-08 MED ORDER — METOPROLOL TARTRATE 25 MG PO TABS: ORAL_TABLET | ORAL | 3 refills | Status: DC

## 2017-08-08 MED ORDER — METOPROLOL TARTRATE 25 MG PO TABS: 37.5000 mg | ORAL_TABLET | Freq: Every day | ORAL | 3 refills | Status: DC

## 2017-08-08 NOTE — Addendum Note (Signed)
Addended by: Neoma LamingPUGH, CHERYL J on: 08/08/2017 10:39 AM   Modules accepted: Orders

## 2017-08-08 NOTE — Telephone Encounter (Signed)
Returned call to patient.She stated she has been taking metoprolol 25 mg in am and 12.5 mg pm.Stated she needs 90 day refill sent to pharmacy.Refill sent to pharmacy.

## 2017-08-08 NOTE — Telephone Encounter (Signed)
New message   Patient calling to clarify medication dosage. Please call   Pt c/o medication issue:  1. Name of Medication: metoprolol tartrate (LOPRESSOR) 25 MG tablet  2. How are you currently taking this medication (dosage and times per day)? 1.5 tab per day  3. Are you having a reaction (difficulty breathing--STAT)? no  4. What is your medication issue? Patient states medication sent to pharmacy has the wrong dosage

## 2017-08-19 ENCOUNTER — Telehealth: Payer: BLUE CROSS/BLUE SHIELD | Admitting: Family

## 2017-08-19 DIAGNOSIS — J069 Acute upper respiratory infection, unspecified: Secondary | ICD-10-CM

## 2017-08-19 MED ORDER — FLUTICASONE PROPIONATE 50 MCG/ACT NA SUSP: 2.0000 | Freq: Every day | NASAL | 6 refills | Status: DC

## 2017-08-19 MED ORDER — BENZONATATE 100 MG PO CAPS: 100.0000 mg | ORAL_CAPSULE | Freq: Three times a day (TID) | ORAL | 0 refills | Status: DC | PRN

## 2017-08-19 NOTE — Progress Notes (Signed)

## 2017-08-28 DIAGNOSIS — M9902 Segmental and somatic dysfunction of thoracic region: Secondary | ICD-10-CM | POA: Diagnosis not present

## 2017-08-28 DIAGNOSIS — M9901 Segmental and somatic dysfunction of cervical region: Secondary | ICD-10-CM | POA: Diagnosis not present

## 2017-08-28 DIAGNOSIS — M531 Cervicobrachial syndrome: Secondary | ICD-10-CM | POA: Diagnosis not present

## 2017-08-28 DIAGNOSIS — R51 Headache: Secondary | ICD-10-CM | POA: Diagnosis not present

## 2017-12-12 DIAGNOSIS — M9901 Segmental and somatic dysfunction of cervical region: Secondary | ICD-10-CM | POA: Diagnosis not present

## 2017-12-12 DIAGNOSIS — M531 Cervicobrachial syndrome: Secondary | ICD-10-CM | POA: Diagnosis not present

## 2017-12-12 DIAGNOSIS — R51 Headache: Secondary | ICD-10-CM | POA: Diagnosis not present

## 2017-12-12 DIAGNOSIS — M9902 Segmental and somatic dysfunction of thoracic region: Secondary | ICD-10-CM | POA: Diagnosis not present

## 2018-02-06 DIAGNOSIS — E039 Hypothyroidism, unspecified: Secondary | ICD-10-CM | POA: Diagnosis not present

## 2018-02-06 DIAGNOSIS — J0110 Acute frontal sinusitis, unspecified: Secondary | ICD-10-CM | POA: Diagnosis not present

## 2018-02-06 DIAGNOSIS — M2550 Pain in unspecified joint: Secondary | ICD-10-CM | POA: Diagnosis not present

## 2018-02-06 DIAGNOSIS — H9202 Otalgia, left ear: Secondary | ICD-10-CM | POA: Diagnosis not present

## 2018-02-06 DIAGNOSIS — J0100 Acute maxillary sinusitis, unspecified: Secondary | ICD-10-CM | POA: Diagnosis not present

## 2018-03-13 ENCOUNTER — Encounter: Payer: Self-pay | Admitting: Allergy and Immunology

## 2018-03-13 ENCOUNTER — Ambulatory Visit: Payer: BLUE CROSS/BLUE SHIELD | Admitting: Allergy and Immunology

## 2018-03-13 ENCOUNTER — Telehealth: Payer: Self-pay | Admitting: Cardiovascular Disease

## 2018-03-13 VITALS — BP 110/70 | HR 68 | Temp 98.2°F | Resp 16 | Ht 61.0 in | Wt 201.6 lb

## 2018-03-13 DIAGNOSIS — L308 Other specified dermatitis: Secondary | ICD-10-CM

## 2018-03-13 DIAGNOSIS — J3089 Other allergic rhinitis: Secondary | ICD-10-CM | POA: Diagnosis not present

## 2018-03-13 DIAGNOSIS — R140 Abdominal distension (gaseous): Secondary | ICD-10-CM

## 2018-03-13 DIAGNOSIS — R51 Headache: Secondary | ICD-10-CM

## 2018-03-13 DIAGNOSIS — R5382 Chronic fatigue, unspecified: Secondary | ICD-10-CM

## 2018-03-13 DIAGNOSIS — L989 Disorder of the skin and subcutaneous tissue, unspecified: Secondary | ICD-10-CM

## 2018-03-13 DIAGNOSIS — R519 Headache, unspecified: Secondary | ICD-10-CM

## 2018-03-13 MED ORDER — CYPROHEPTADINE HCL 4 MG PO TABS: ORAL_TABLET | ORAL | 5 refills | Status: DC

## 2018-03-13 MED ORDER — MONTELUKAST SODIUM 10 MG PO TABS: 10.0000 mg | ORAL_TABLET | Freq: Every day | ORAL | 5 refills | Status: DC

## 2018-03-13 MED ORDER — OMEPRAZOLE 40 MG PO CPDR: 40.0000 mg | DELAYED_RELEASE_CAPSULE | Freq: Every day | ORAL | 5 refills | Status: DC

## 2018-03-13 MED ORDER — MOMETASONE FUROATE 0.1 % EX OINT: TOPICAL_OINTMENT | CUTANEOUS | 0 refills | Status: DC

## 2018-03-13 NOTE — Telephone Encounter (Signed)
Pt called to report that she has been having extreme fatigue especially during the day. She was seeing her allergist today, Dr. Lucie LeatherKozlow, and he advised her that it may be her Metoprolol 25mg  am and 12.5mg  pm... She says that her heart has been doing well and she has not had any palpitations. However, her fatigue has been so bad that it has affected her while driving. I advised her that I will forward her concern on to Dr. Duke Salviaandolph and will let her know her recommendations. Her BP today was 120/70 and HR 67. Pt agrees and will wait for a call back.

## 2018-03-13 NOTE — Patient Instructions (Addendum)
  1.  Allergen avoidance measures  2.  Treat and prevent inflammation:   A.  OTC Nasacort 1 spray each nostril 1 time per day  B.  Montelukast 10 mg tablet 1 time per day  C.  Mometasone 0.1% ointment to arms 1 time per day after shower/bath  3.  Treat and prevent headache:   A.  Slowly taper off all forms of caffeine  B.  Periactin 4 mg tablet -1/2 to 1 tablet at bedtime  4.  Treat and prevent reflux:   A.  Slowly taper off all forms of caffeine  B.  Omeprazole 40 mg tablet in a.m.  4.  Treat possible metoprolol induced fatigue by choosing alternate blood pressure medicine  5.  Evaluate bloating: Blood -celiac screen with IgA  6.  Return to clinic in 4 weeks or earlier if problem  7.  Obtain full flu vaccine

## 2018-03-13 NOTE — Telephone Encounter (Signed)
New Message:      Pt c/o medication issue:  1. Name of Medication: metoprolol tartrate (LOPRESSOR) 25 MG tablet  2. How are you currently taking this medication (dosage and times per day)?   3. Are you having a reaction (difficulty breathing--STAT)? No  4. What is your medication issue? Pt states this medication is causing her to be very fatigue to the point of falling asleep behind the will. Pt states that her allergy doctor states this is the cause of her fatigue

## 2018-03-13 NOTE — Progress Notes (Signed)
Dear Peri Maris,  Thank you for referring [name] to the [facility] [place] on 03/13/2018.   Below is a summation of this patient's evaluation and recommendations.  Thank you for your referral. I will keep you informed about this patient's response to treatment.   If you have any questions please do not hesitate to contact me.   Sincerely,  Jessica Priest, MD Allergy / Immunology [facility]   ______________________________________________________________________    NEW PATIENT NOTE  Referring Provider: Soundra Pilon, FNP Primary Provider: Clayborn Heron, MD Date of office visit: 03/13/2018    Subjective:   Chief Complaint:  [name] (DOB: 04/01/1966) is a 52 y.o. female who presents to the clinic on 03/13/2018 with a chief complaint of Nasal Congestion; Cough; Pruritis; and Fatigue .     HPI: [name] presents to this clinic in evaluation of several different issues.  First, she has postprandial nausea and bloating of greater than 10 years duration.  She also has burping and regurgitation on a common basis.  She has not really had any specific evaluation for this issue.  She does not really note an obvious food that gives rise to this issue but if she eats ice cream she definitely gets a lot worse although she can eat cheese without any problem. This issue appears to occur with most meals.  She does drink 2 coffees per day and intermittently has chocolate.   Second, she has itchy skin involving her arms and she scratches her skin constantly.  This has been a long-standing issue.  There is no associated systemic or constitutional symptoms and this appears to occur on a daily basis.  Third, she has a headache.  She has a headache that starts in her neck and goes up to her face and can globalized.  She has a daily low-grade headache and then she develops a very bad headache about 3  times per month.  Usually this headache is pounding and makes her lay down for several hours.  Fourth, she has nasal congestion and sneezing on a pretty regular basis and rarely developed some issues with coughing but no shortness of breath or chest tightness.  Fifth, she is fatigued and she gets extremely tired.  She gets really tired after eating as well.  She apparently had a sleep study 1 year ago which did not identify any high degree of sleep study.  It should be noted that she has been using metoprolol for the past 2 years and prior to that atenolol.  Past Medical History:  Diagnosis Date  . Anxiety   . Hypothyroid   . Recurrent upper respiratory infection (URI)   . SVT (supraventricular tachycardia) (HCC) 10/19/2015  . Tachycardia     Past Surgical History:  Procedure Laterality Date  . ADENOIDECTOMY    . CESAREAN SECTION    . LAPAROSCOPY    . myomectory      Allergies as of 03/13/2018      Reactions   Promethazine Hcl Nausea Only      Medication List      citalopram 40 MG tablet Commonly known as:  CELEXA Take 40 mg by mouth daily.   Fish Oil 1000 MG Caps Take 1 capsule by mouth daily.   ibuprofen 200 MG tablet Commonly known as:  ADVIL,MOTRIN Take 200-400 mg by mouth every 6 (six) hours as needed for headache or moderate pain.  levothyroxine 75 MCG tablet Commonly known as:  SYNTHROID, LEVOTHROID Take 75 mcg by mouth daily.   metoprolol tartrate 25 MG tablet Commonly known as:  LOPRESSOR Take 25 mg am and 12.5 mg pm   PROBIOTIC DAILY PO Take 1 capsule by mouth daily.   Vitamin D 2000 units Caps Take 2,000 Units by mouth daily.       Review of systems negative except as noted in HPI / PMHx or noted below:  Review of Systems  Constitutional: Negative.   HENT: Negative.   Eyes: Negative.   Respiratory: Negative.   Cardiovascular: Negative.   Gastrointestinal: Negative.   Genitourinary: Negative.   Musculoskeletal: Negative.   Skin: Negative.    Neurological: Negative.   Endo/Heme/Allergies: Negative.   Psychiatric/Behavioral: Negative.     Family History  Problem Relation Age of Onset  . Cancer Father   . Diabetes Father   . Heart disease Father   . Hyperlipidemia Father   . Cancer Maternal Grandmother   . Dementia Maternal Grandmother   . Heart disease Paternal Grandmother   . Hypertension Paternal Grandmother   . Diabetes Paternal Grandfather   . Heart disease Paternal Grandfather   . Hyperlipidemia Paternal Grandfather   . Hypertension Paternal Grandfather   . COPD Mother   . Asthma Mother   . Allergic rhinitis Mother   . Allergic rhinitis Sister   . Liver disease Maternal Grandfather   . Cancer Maternal Grandfather   . Allergic rhinitis Sister   . Angioedema Neg Hx   . Eczema Neg Hx   . Urticaria Neg Hx     Social History   Socioeconomic History  . Marital status: Married    Spouse name: Not on file  . Number of children: Not on file  . Years of education: Not on file  . Highest education level: Not on file  Occupational History  . Not on file  Social Needs  . Financial resource strain: Not on file  . Food insecurity:    Worry: Not on file    Inability: Not on file  . Transportation needs:    Medical: Not on file    Non-medical: Not on file  Tobacco Use  . Smoking status: Never Smoker  . Smokeless tobacco: Never Used  Substance and Sexual Activity  . Alcohol use: No  . Drug use: No  . Sexual activity: Never  Lifestyle  . Physical activity:    Days per week: Not on file    Minutes per session: Not on file  . Stress: Not on file  Relationships  . Social connections:    Talks on phone: Not on file    Gets together: Not on file    Attends religious service: Not on file    Active member of club or organization: Not on file    Attends meetings of clubs or organizations: Not on file    Relationship status: Not on file  . Intimate partner violence:    Fear of current or ex partner: Not on  file    Emotionally abused: Not on file    Physically abused: Not on file    Forced sexual activity: Not on file  Other Topics Concern  . Not on file  Social History Narrative  . Not on file    Environmental and Social history  Lives in a house with a dry environment, cats and dogs located inside the household, carpet in the bedroom, plastic on the bed, no plastic on the  pillow, no smokers located inside the household.  Objective:   Vitals:   03/13/18 0912  BP: 110/70  Pulse: 68  Resp: 16  Temp: 98.2 F (36.8 C)   Height: 5\' 1"  (154.9 cm) Weight: 201 lb 9.6 oz (91.4 kg)  Physical Exam  HENT:  Head: Normocephalic.  Right Ear: Tympanic membrane, external ear and ear canal normal.  Left Ear: Tympanic membrane, external ear and ear canal normal.  Nose: Nose normal. No mucosal edema or rhinorrhea.  Mouth/Throat: Uvula is midline, oropharynx is clear and moist and mucous membranes are normal. No oropharyngeal exudate.  Eyes: Conjunctivae are normal.  Neck: Trachea normal. No tracheal tenderness present. No tracheal deviation present. No thyromegaly present.  Cardiovascular: Normal rate, regular rhythm, S1 normal, S2 normal and normal heart sounds.  No murmur heard. Pulmonary/Chest: Breath sounds normal. No stridor. No respiratory distress. She has no wheezes. She has no rales.  Musculoskeletal: She exhibits no edema.  Lymphadenopathy:       Head (right side): No tonsillar adenopathy present.       Head (left side): No tonsillar adenopathy present.    She has no cervical adenopathy.  Neurological: She is alert.  Skin: Rash (Slight lichenification and scattered well-healed papules upper arms bilaterally) noted. She is not diaphoretic. No erythema. Nails show no clubbing.    Diagnostics: Allergy skin tests were performed.  She demonstrated hypersensitivity to grasses, mold, and cat.  She did not demonstrate any hypersensitivity to foods.  Assessment and Plan:    1.  Perennial allergic rhinitis   2. Headache disorder   3. Abdominal bloating   4. Chronic fatigue   5. Inflammatory dermatosis      1.  Allergen avoidance measures  2.  Treat and prevent inflammation:   A.  OTC Nasacort 1 spray each nostril 1 time per day  B.  Montelukast 10 mg tablet 1 time per day  C.  Mometasone 0.1% ointment to arms 1 time per day after shower/bath  3.  Treat and prevent headache:   A.  Slowly taper off all forms of caffeine  B.  Periactin 4 mg tablet -1/2 to 1 tablet at bedtime  4.  Treat and prevent reflux:   A.  Slowly taper off all forms of caffeine  B.  Omeprazole 40 mg tablet in a.m.  4.  Treat possible metoprolol induced fatigue by choosing alternate blood pressure medicine  5.  Evaluate bloating: Blood -celiac screen with IgA  6.  Return to clinic in 4 weeks or earlier if problem  7.  Obtain full flu vaccine   [name] appears to have a degree of inflammation affecting her respiratory tract based upon her atopic immune system and we will get her to perform allergen avoidance measures and use anti-inflammatory agents for her airway as noted above.  As well, she appears to have a component of reflux, a chronic headache disorder tied up with migraines, and fatigue which we will attempt to address by having her taper off all caffeine, utilizing treatment directed against reflux, using cyproheptadine prior to bedtime to hopefully increase her quality of sleep and prevent headaches and she will discuss with her primary care doctor about the possibility that metoprolol may be contributing to some of her fatigue.  I have also ordered a celiac screen to investigate her abdominal bloating.  I will see her back in this clinic in 4 weeks or earlier if there is a problem.  Jessica PriestEric J. Ziara Thelander, MD Allergy / Immunology  [facility] [place]

## 2018-03-14 ENCOUNTER — Encounter: Payer: Self-pay | Admitting: Allergy and Immunology

## 2018-03-14 NOTE — Telephone Encounter (Signed)
She can try taking it only as needed.

## 2018-03-14 NOTE — Telephone Encounter (Signed)
Advised patient, verbalized understanding  

## 2018-03-15 LAB — CELIAC PANEL 10
Antigliadin Abs, IgA: 4 units (ref 0–19)
ENDOMYSIAL IGA: NEGATIVE
Gliadin IgG: 2 units (ref 0–19)
IGA/IMMUNOGLOBULIN A, SERUM: 221 mg/dL (ref 87–352)
Tissue Transglut Ab: <2 U/mL (ref 0–5)

## 2018-04-10 ENCOUNTER — Ambulatory Visit: Payer: BLUE CROSS/BLUE SHIELD | Admitting: Allergy and Immunology

## 2018-04-10 VITALS — BP 118/74 | HR 64 | Resp 16

## 2018-04-10 DIAGNOSIS — J3089 Other allergic rhinitis: Secondary | ICD-10-CM

## 2018-04-10 DIAGNOSIS — R51 Headache: Secondary | ICD-10-CM | POA: Diagnosis not present

## 2018-04-10 DIAGNOSIS — R140 Abdominal distension (gaseous): Secondary | ICD-10-CM

## 2018-04-10 DIAGNOSIS — L989 Disorder of the skin and subcutaneous tissue, unspecified: Secondary | ICD-10-CM

## 2018-04-10 DIAGNOSIS — R5382 Chronic fatigue, unspecified: Secondary | ICD-10-CM | POA: Diagnosis not present

## 2018-04-10 DIAGNOSIS — R519 Headache, unspecified: Secondary | ICD-10-CM

## 2018-04-10 DIAGNOSIS — L308 Other specified dermatitis: Secondary | ICD-10-CM

## 2018-04-10 NOTE — Progress Notes (Signed)
Follow-up Note  Referring Provider: Soundra Pilon, FNP Primary Provider: Clayborn Heron, MD Date of Office Visit: 04/10/2018  Subjective:   [name] (DOB: 05-Aug-1965) is a 52 y.o. female who [facility] 04[facility]10/2018 in re-evaluation of the following:  HPI: [name] presents to this clinic in evaluation of allergic rhinitis, chronic headache disorder, chronic fatigue, abdominal bloating, and an inflammatory dermatosis addressed during her initial evaluation 13 March 2018.  Concerning her postprandial nausea and bloating of 10 years duration, she is better.  She has been using omeprazole daily and has somewhat consolidate her caffeine consumption.  However, she still has times when she eats and developed some queasiness.  She has seen Dr. Audley Hose in the past for colonoscopy but never had an upper endoscopy  Concerning her itchy skin, she is significantly improved.  She has only been using her cream just a few times.  She has had a decrease in the intensity and frequency of her headaches.  She has not had any bad headaches.  She has been using 2 mg of Periactin at bedtime.  She has for the most part resolved her nasal congestion and sneezing.  Since she consolidated her metoprolol dosage she is much better regarding her fatigue.  She is now using 12.5 mg at bedtime only.  Allergies as of 04/10/2018      Reactions   Promethazine Hcl Nausea Only      Medication List      citalopram 40 MG tablet Commonly known as:  CELEXA Take 40 mg by mouth daily.   cyproheptadine 4 MG tablet Commonly known as:  PERIACTIN 1/2 to 1 tablet at bedtime   Fish Oil 1000 MG Caps Take 1 capsule by mouth daily.   fluticasone 50 MCG/ACT nasal spray Commonly known as:  FLONASE Place 2 sprays into both nostrils daily.   ibuprofen 200 MG tablet Commonly known as:  ADVIL,MOTRIN Take 200-400 mg by mouth every 6 (six) hours as needed for headache or moderate pain.   levothyroxine 75 MCG tablet Commonly known as:  SYNTHROID, LEVOTHROID Take 75 mcg by mouth daily.   metoprolol tartrate 25 MG tablet Commonly known as:  LOPRESSOR Take 25 mg by mouth as needed (palpitations).   mometasone 0.1 % ointment Commonly known as:  ELOCON Apply to arms 1 time per day after shower/bath   montelukast 10 MG tablet Commonly known as:  SINGULAIR Take 1 tablet (10 mg total) by mouth at bedtime.   omeprazole 40 MG capsule Commonly known as:  PRILOSEC Take 1 capsule (40 mg total) by mouth daily.   PROBIOTIC DAILY PO Take 1 capsule by mouth daily.   Vitamin D 2000 units Caps Take 2,000 Units by mouth daily.       Past Medical History:  Diagnosis Date  . Anxiety   . Hypothyroid   . Recurrent upper respiratory infection (URI)   . SVT (supraventricular tachycardia) (HCC) 10/19/2015  . Tachycardia     Past Surgical History:  Procedure Laterality Date  . ADENOIDECTOMY    . CESAREAN SECTION    . LAPAROSCOPY    . myomectory      Review of systems negative except as noted in HPI / PMHx or noted below:  Review of Systems  Constitutional: Negative.   HENT: Negative.   Eyes: Negative.   Respiratory: Negative.   Cardiovascular: Negative.   Gastrointestinal: Negative.   Genitourinary: Negative.   Musculoskeletal: Negative.   Skin: Negative.  Neurological: Negative.   Endo/Heme/Allergies: Negative.   Psychiatric/Behavioral: Negative.      Objective:   Vitals:   04/10/18 1059  BP: 118/74  Pulse: 64  Resp: 16  SpO2: 96%          Physical Exam  HENT:  Head: Normocephalic.  Right Ear: Tympanic membrane, external ear and ear canal normal.  Left Ear: Tympanic membrane, external ear and ear canal normal.  Nose: Nose normal. No mucosal edema or rhinorrhea.  Mouth/Throat: Uvula is midline, oropharynx is clear and moist and mucous membranes are normal. No oropharyngeal exudate.  Eyes: Conjunctivae are normal.  Neck: Trachea normal. No  tracheal tenderness present. No tracheal deviation present. No thyromegaly present.  Cardiovascular: Normal rate, regular rhythm, S1 normal, S2 normal and normal heart sounds.  No murmur heard. Pulmonary/Chest: Breath sounds normal. No stridor. No respiratory distress. She has no wheezes. She has no rales.  Musculoskeletal: She exhibits no edema.  Lymphadenopathy:       Head (right side): No tonsillar adenopathy present.       Head (left side): No tonsillar adenopathy present.    She has no cervical adenopathy.  Neurological: She is alert.  Skin: No rash noted. She is not diaphoretic. No erythema. Nails show no clubbing.    Diagnostics:    Results of blood tests obtained 13 March 2018 identified a tissue transglutaminase IgA antibody less than 2U/mL with a serum IgE level of 221 mg/DL  Assessment and Plan:   1. Perennial allergic rhinitis   2. Headache disorder   3. Abdominal bloating   4. Chronic fatigue   5. Inflammatory dermatosis     1.  Continue Allergen avoidance measures as best as possible  2.  Continue to Treat and prevent inflammation:   A.  OTC Nasacort 1 spray each nostril 1 time per day  B.  Montelukast 10 mg tablet 1 time per day  C.  Mometasone 0.1% ointment to arms 1 time per day after shower/bath  3.  Continue to Treat and prevent headache:   A.  Slowly taper off all forms of caffeine  B.  Periactin 4 mg tablet -1/2 at bedtime  4.  Continue to Treat and prevent reflux:   A.  Slowly taper off all forms of caffeine  B.  Omeprazole 40 mg tablet in a.m.  4.  Return to clinic in 12 weeks or earlier if problem  5.  Obtain full flu vaccine   6. Evaluation with GI for stomach issues?  [name] appears to be better regarding her airway issue and her skin issue and her headache issue and her GI issue.  She will continue to utilize a collection of therapy directed against inflammation and reflux as noted above and will continue to use Periactin at bedtime.  The  one issue that I think may require some further evaluation is her postprandial queasiness that, although improved, still appears to be somewhat active on occasion.  I have asked her to contact Dr. Audley Hose for further evaluation of this issue if it is a persistent issue.  Of course, she may do much better if she tapered off her caffeine and I once again had a discussion with her about this issue.  I will see her back in this clinic in 12 weeks or earlier if there is a problem.  Laurette Schimke, MD Allergy / Immunology [facility]

## 2018-04-10 NOTE — Patient Instructions (Signed)
  1.  Continue Allergen avoidance measures as best as possible  2.  Continue to Treat and prevent inflammation:   A.  OTC Nasacort 1 spray each nostril 1 time per day  B.  Montelukast 10 mg tablet 1 time per day  C.  Mometasone 0.1% ointment to arms 1 time per day after shower/bath  3.  Continue to Treat and prevent headache:   A.  Slowly taper off all forms of caffeine  B.  Periactin 4 mg tablet -1/2 at bedtime  4.  Continue to Treat and prevent reflux:   A.  Slowly taper off all forms of caffeine  B.  Omeprazole 40 mg tablet in a.m.  4.  Return to clinic in 12 weeks or earlier if problem  5.  Obtain full flu vaccine   6. Evaluation with GI for stomach issues?

## 2018-04-11 ENCOUNTER — Encounter: Payer: Self-pay | Admitting: Allergy and Immunology

## 2018-04-29 DIAGNOSIS — E669 Obesity, unspecified: Secondary | ICD-10-CM | POA: Diagnosis not present

## 2018-04-29 DIAGNOSIS — R635 Abnormal weight gain: Secondary | ICD-10-CM | POA: Diagnosis not present

## 2018-04-29 DIAGNOSIS — E039 Hypothyroidism, unspecified: Secondary | ICD-10-CM | POA: Diagnosis not present

## 2018-05-02 DIAGNOSIS — R635 Abnormal weight gain: Secondary | ICD-10-CM | POA: Diagnosis not present

## 2018-05-02 DIAGNOSIS — E669 Obesity, unspecified: Secondary | ICD-10-CM | POA: Diagnosis not present

## 2018-05-09 DIAGNOSIS — Z800 Family history of malignant neoplasm of digestive organs: Secondary | ICD-10-CM | POA: Diagnosis not present

## 2018-05-09 DIAGNOSIS — Z6838 Body mass index (BMI) 38.0-38.9, adult: Secondary | ICD-10-CM | POA: Diagnosis not present

## 2018-05-09 DIAGNOSIS — Z803 Family history of malignant neoplasm of breast: Secondary | ICD-10-CM | POA: Diagnosis not present

## 2018-05-09 DIAGNOSIS — Z808 Family history of malignant neoplasm of other organs or systems: Secondary | ICD-10-CM | POA: Diagnosis not present

## 2018-05-09 DIAGNOSIS — Z1231 Encounter for screening mammogram for malignant neoplasm of breast: Secondary | ICD-10-CM | POA: Diagnosis not present

## 2018-05-09 DIAGNOSIS — Z01419 Encounter for gynecological examination (general) (routine) without abnormal findings: Secondary | ICD-10-CM | POA: Diagnosis not present

## 2018-05-09 DIAGNOSIS — Z801 Family history of malignant neoplasm of trachea, bronchus and lung: Secondary | ICD-10-CM | POA: Diagnosis not present

## 2018-05-20 DIAGNOSIS — M9903 Segmental and somatic dysfunction of lumbar region: Secondary | ICD-10-CM | POA: Diagnosis not present

## 2018-05-20 DIAGNOSIS — M5386 Other specified dorsopathies, lumbar region: Secondary | ICD-10-CM | POA: Diagnosis not present

## 2018-05-20 DIAGNOSIS — M9904 Segmental and somatic dysfunction of sacral region: Secondary | ICD-10-CM | POA: Diagnosis not present

## 2018-05-20 DIAGNOSIS — M5137 Other intervertebral disc degeneration, lumbosacral region: Secondary | ICD-10-CM | POA: Diagnosis not present

## 2018-07-02 ENCOUNTER — Ambulatory Visit: Payer: BLUE CROSS/BLUE SHIELD | Admitting: Allergy and Immunology

## 2018-07-02 ENCOUNTER — Encounter: Payer: Self-pay | Admitting: Allergy and Immunology

## 2018-07-02 VITALS — BP 122/76 | HR 64 | Resp 16

## 2018-07-02 DIAGNOSIS — K219 Gastro-esophageal reflux disease without esophagitis: Secondary | ICD-10-CM

## 2018-07-02 DIAGNOSIS — J3089 Other allergic rhinitis: Secondary | ICD-10-CM

## 2018-07-02 DIAGNOSIS — R51 Headache: Secondary | ICD-10-CM | POA: Diagnosis not present

## 2018-07-02 DIAGNOSIS — R5382 Chronic fatigue, unspecified: Secondary | ICD-10-CM | POA: Diagnosis not present

## 2018-07-02 DIAGNOSIS — L989 Disorder of the skin and subcutaneous tissue, unspecified: Secondary | ICD-10-CM

## 2018-07-02 DIAGNOSIS — R519 Headache, unspecified: Secondary | ICD-10-CM

## 2018-07-02 DIAGNOSIS — L308 Other specified dermatitis: Secondary | ICD-10-CM

## 2018-07-02 NOTE — Progress Notes (Signed)
Follow-up Note   Referring Provider: Clayborn Smith, Monica R, MD Primary Provider: Clayborn Smith, Monica R, MD Date of Office Visit: 07/02/2018  Subjective:   [name] (DOB: 07/01/1966) is a 52 y.o. female who [facility] in re-evaluation of the following:  HPI: [name] returns to this clinic in reevaluation of allergic rhinitis, chronic headache disorder, chronic fatigue secondary to metoprolol use, reflux with abdominal bloating, and a history of inflammatory dermatosis.  Her last visit to this clinic was 10 April 2018 at which point in time she was doing very well regarding each issue.  She continues to do well with her airway.  There was a 2-week interval last month in which she had nasal congestion and face fullness and some positional vertigo but fortunately this appears to have resolved.  There was no fever or ugly nasal discharge or anosmia.  She does have a history of intermittent positional vertigo about twice a year or so.  She continues to use montelukast and rarely uses any Nasacort.  She continues to do well with her headaches.  She has very little headache while using 2 mg of Periactin at bedtime.  Her sleep does well when she does go to sleep.  It sounds as though she does not get 8 hours of sleep per night for the fact that she has several interests that arise at nighttime.  She has not been having a tremendous amount of problems with her stomach.  She has been consistently using her omeprazole and she has dramatically consolidated her caffeine to a half caffeinated coffee in the morning.  Since she has consolidated her metoprolol dose she really has no issues with fatigue at this point.  Rarely does she require any topical mometasone for the pruritic disorder involving her arms.  Allergies as of 07/02/2018      Reactions   Promethazine Hcl Nausea Only      Medication List      citalopram 40 MG tablet Commonly known as:   CELEXA Take 40 mg by mouth daily.   cyproheptadine 4 MG tablet Commonly known as:  PERIACTIN 1/2 to 1 tablet at bedtime   Fish Oil 1000 MG Caps Take 1 capsule by mouth daily.   fluticasone 50 MCG/ACT nasal spray Commonly known as:  FLONASE Place 2 sprays into both nostrils daily.   ibuprofen 200 MG tablet Commonly known as:  ADVIL,MOTRIN Take 200-400 mg by mouth every 6 (six) hours as needed for headache or moderate pain.   levothyroxine 75 MCG tablet Commonly known as:  SYNTHROID, LEVOTHROID Take 75 mcg by mouth daily.   metoprolol tartrate 25 MG tablet Commonly known as:  LOPRESSOR Take 25 mg by mouth as needed (palpitations).   mometasone 0.1 % ointment Commonly known as:  ELOCON Apply to arms 1 time per day after shower/bath   montelukast 10 MG tablet Commonly known as:  SINGULAIR Take 1 tablet (10 mg total) by mouth at bedtime.   omeprazole 40 MG capsule Commonly known as:  PRILOSEC Take 1 capsule (40 mg total) by mouth daily.   PROBIOTIC DAILY PO Take 1 capsule by mouth daily.   Vitamin D 50 MCG (2000 UT) Caps Take 2,000 Units by mouth daily.       Past Medical History:  Diagnosis Date  . Anxiety   . Hypothyroid   . Recurrent upper respiratory infection (URI)   . SVT (supraventricular tachycardia) (HCC) 10/19/2015  . Tachycardia  Past Surgical History:  Procedure Laterality Date  . ADENOIDECTOMY    . CESAREAN SECTION    . LAPAROSCOPY    . myomectory      Review of systems negative except as noted in HPI / PMHx or noted below:  Review of Systems  Constitutional: Negative.   HENT: Negative.   Eyes: Negative.   Respiratory: Negative.   Cardiovascular: Negative.   Gastrointestinal: Negative.   Genitourinary: Negative.   Musculoskeletal: Negative.   Skin: Negative.   Neurological: Negative.   Endo/Heme/Allergies: Negative.   Psychiatric/Behavioral: Negative.      Objective:   Vitals:   07/02/18 1115  BP: 122/76  Pulse: 64    Resp: 16          Physical Exam Constitutional:      Appearance: She is not diaphoretic.  HENT:     Head: Normocephalic.     Right Ear: Tympanic membrane, ear canal and external ear normal.     Left Ear: Tympanic membrane, ear canal and external ear normal.     Nose: Nose normal. No mucosal edema or rhinorrhea.     Mouth/Throat:     Pharynx: Uvula midline. No oropharyngeal exudate.  Eyes:     Conjunctiva/sclera: Conjunctivae normal.  Neck:     Thyroid: No thyromegaly.     Trachea: Trachea normal. No tracheal tenderness or tracheal deviation.  Cardiovascular:     Rate and Rhythm: Normal rate and regular rhythm.     Heart sounds: Normal heart sounds, S1 normal and S2 normal. No murmur.  Pulmonary:     Effort: No respiratory distress.     Breath sounds: Normal breath sounds. No stridor. No wheezing or rales.  Lymphadenopathy:     Head:     Right side of head: No tonsillar adenopathy.     Left side of head: No tonsillar adenopathy.     Cervical: No cervical adenopathy.  Skin:    Findings: No erythema or rash.     Nails: There is no clubbing.   Neurological:     Mental Status: She is alert.     Diagnostics: none  Assessment and Plan:   1. Perennial allergic rhinitis   2. Headache disorder   3. Gastroesophageal reflux disease, esophagitis presence not specified   4. Chronic fatigue   5. Inflammatory dermatosis     1.  Continue Allergen avoidance measures as best as possible  2.  Continue to Treat and prevent inflammation:   A.  OTC Nasacort 1 spray each nostril 1 time per day  B.  Montelukast 10 mg tablet 1 time per day  C.  Mometasone 0.1% ointment to arms 1 time per day    3.  Continue to Treat and prevent headache:   A.  Slowly taper off all forms of caffeine  B.  Periactin 4 mg tablet -1/2 at bedtime  4.  Continue to Treat and prevent reflux:   A.  Slowly taper off all forms of caffeine  B.  Omeprazole 40 mg tablet in a.m.  4.  Return to clinic in  6 months or earlier if problem  Overall [name] appears to be doing relatively well at this point and I do not see any need for changing her therapy.  She understands how all these medications work and for the most part she has relied on the use of montelukast and Periactin and omeprazole to treat all of her issues successfully.  I will see her back in this clinic in  6 months or earlier if there is a problem.  Allena Katz, MD Allergy / Immunology [place]

## 2018-07-02 NOTE — Patient Instructions (Addendum)
  1.  Continue Allergen avoidance measures as best as possible  2.  Continue to Treat and prevent inflammation:   A.  OTC Nasacort 1 spray each nostril 1 time per day  B.  Montelukast 10 mg tablet 1 time per day  C.  Mometasone 0.1% ointment to arms 1 time per day    3.  Continue to Treat and prevent headache:   A.  Slowly taper off all forms of caffeine  B.  Periactin 4 mg tablet -1/2 at bedtime  4.  Continue to Treat and prevent reflux:   A.  Slowly taper off all forms of caffeine  B.  Omeprazole 40 mg tablet in a.m.  4.  Return to clinic in 6 months or earlier if problem

## 2018-07-03 ENCOUNTER — Encounter: Payer: Self-pay | Admitting: Allergy and Immunology

## 2018-07-03 DIAGNOSIS — Z23 Encounter for immunization: Secondary | ICD-10-CM | POA: Diagnosis not present

## 2018-07-03 DIAGNOSIS — Z13220 Encounter for screening for lipoid disorders: Secondary | ICD-10-CM | POA: Diagnosis not present

## 2018-07-03 DIAGNOSIS — Z0000 Encounter for general adult medical examination without abnormal findings: Secondary | ICD-10-CM | POA: Diagnosis not present

## 2018-07-04 DIAGNOSIS — F331 Major depressive disorder, recurrent, moderate: Secondary | ICD-10-CM | POA: Diagnosis not present

## 2018-07-04 DIAGNOSIS — F411 Generalized anxiety disorder: Secondary | ICD-10-CM | POA: Diagnosis not present

## 2018-08-06 DIAGNOSIS — F411 Generalized anxiety disorder: Secondary | ICD-10-CM | POA: Diagnosis not present

## 2018-08-06 DIAGNOSIS — F331 Major depressive disorder, recurrent, moderate: Secondary | ICD-10-CM | POA: Diagnosis not present

## 2018-08-06 DIAGNOSIS — J0100 Acute maxillary sinusitis, unspecified: Secondary | ICD-10-CM | POA: Diagnosis not present

## 2018-08-06 DIAGNOSIS — F5101 Primary insomnia: Secondary | ICD-10-CM | POA: Diagnosis not present

## 2018-08-20 ENCOUNTER — Encounter (INDEPENDENT_AMBULATORY_CARE_PROVIDER_SITE_OTHER): Payer: BLUE CROSS/BLUE SHIELD

## 2018-08-24 ENCOUNTER — Other Ambulatory Visit: Payer: Self-pay | Admitting: Allergy and Immunology

## 2018-08-30 ENCOUNTER — Other Ambulatory Visit: Payer: Self-pay | Admitting: Allergy and Immunology

## 2018-08-31 ENCOUNTER — Other Ambulatory Visit: Payer: Self-pay | Admitting: Allergy and Immunology

## 2018-09-04 ENCOUNTER — Ambulatory Visit (INDEPENDENT_AMBULATORY_CARE_PROVIDER_SITE_OTHER): Payer: BLUE CROSS/BLUE SHIELD | Admitting: Family Medicine

## 2018-09-04 ENCOUNTER — Encounter (INDEPENDENT_AMBULATORY_CARE_PROVIDER_SITE_OTHER): Payer: Self-pay | Admitting: Family Medicine

## 2018-09-04 VITALS — BP 141/76 | HR 67 | Temp 98.4°F | Ht 60.0 in | Wt 200.0 lb

## 2018-09-04 DIAGNOSIS — R5383 Other fatigue: Secondary | ICD-10-CM | POA: Diagnosis not present

## 2018-09-04 DIAGNOSIS — Z6839 Body mass index (BMI) 39.0-39.9, adult: Secondary | ICD-10-CM

## 2018-09-04 DIAGNOSIS — Z9189 Other specified personal risk factors, not elsewhere classified: Secondary | ICD-10-CM | POA: Diagnosis not present

## 2018-09-04 DIAGNOSIS — E038 Other specified hypothyroidism: Secondary | ICD-10-CM | POA: Diagnosis not present

## 2018-09-04 DIAGNOSIS — R0602 Shortness of breath: Secondary | ICD-10-CM | POA: Diagnosis not present

## 2018-09-04 DIAGNOSIS — E559 Vitamin D deficiency, unspecified: Secondary | ICD-10-CM

## 2018-09-04 DIAGNOSIS — F418 Other specified anxiety disorders: Secondary | ICD-10-CM

## 2018-09-04 DIAGNOSIS — E8881 Metabolic syndrome: Secondary | ICD-10-CM | POA: Diagnosis not present

## 2018-09-04 DIAGNOSIS — Z0289 Encounter for other administrative examinations: Secondary | ICD-10-CM

## 2018-09-04 NOTE — Progress Notes (Signed)
Office: [phone]  /  Fax: [phone]   Dear Dr. Marcelle OverlieMichelle Grewal,   Thank you for referring [name] to our clinic. The following note includes my evaluation and treatment recommendations.  HPI:   Chief Complaint: OBESITY    [name] has been referred by Dr. Marcelle OverlieMichelle Grewal for consultation regarding her obesity and obesity related comorbidities.    Monica Hoffanya Simonin (MR# [id number]) is a 53 y.o. female who presents on 09/04/2018 for obesity evaluation and treatment. Current BMI is Body mass index is 39.06 kg/m.  [name] has been struggling with her weight for many years and has been unsuccessful in either losing weight, maintaining weight loss, or reaching her healthy weight goal.     [name] attended our information session and states she is currently in the action stage of change and ready to dedicate time achieving and maintaining a healthier weight. [name] is interested in becoming our patient and working on intensive lifestyle modifications including (but not limited to) diet, exercise and weight loss.    [name] states her family eats meals together she thinks her family will eat healthier with her her desired weight loss is 70 lbs she started gaining weight in 2000 her heaviest weight ever was 205 lbs. she has significant food cravings issues  she snacks frequently in the evenings she would like to eat vegetarian she is frequently drinking liquids with calories she sometimes makes poor food choices she has binge eating behaviors   Fatigue [name] feels her energy is lower than it should be. This has worsened with weight gain and has not worsened recently. [name] admits to daytime somnolence. Patient is at risk for obstructive sleep apnea. Patent has a history of symptoms of Epworth sleepiness scale and morning headache. Patient generally gets 4 to 6 hours of sleep per night, and states they generally have restless sleep. Snoring is present. Apneic episodes have been present on 2  episodes. Epworth Sleepiness Score is 16.  Dyspnea on exertion Londyn notes increasing shortness of breath with exercising and seems to be worsening over time with weight gain. She notes getting out of breath sooner with activity than she used to. This has not gotten worse recently. [name] denies orthopnea.  Vitamin D deficiency [name] has a diagnosis of vitamin D deficiency. She is currently taking vit D and does not have recent labs. She denies nausea, vomiting,  or muscle weakness.  Hypothyroid [name] has a diagnosis of hypothyroidism. She is on levothyroxine 75 mcg. She admits fatigue and denies palpitations today as she has a history of supraventricular tachycardia.  At risk for cardiovascular disease [name] is at a higher than average risk for cardiovascular disease due to hypothyroid and obesity. She currently denies any chest pain.  Depression with Anxiety [name] is on Celexa and Wellbutrin and has significant stressors in her life and is struggling with emotional eating and using food for comfort to the extent that it is negatively impacting her health. Her PHQ-9 is 17. She is caring for her mother in law, who's health is failing and her son that had a suicide attempt last year.  Depression Screen [name]'s Food and Mood (modified PHQ-9) score was 17. [facility] 09/04[facility]2018 08/15/2015  Decreased Interest 3 0  Down, Depressed, Hopeless 3 0  PHQ - 2 Score 6 0  Altered sleeping 3 -  Tired, decreased energy 3 -  Change in appetite 1 -  Feeling bad or failure about yourself  1 -  Trouble concentrating 3 -  Moving slowly or fidgety/restless 0 -  Suicidal thoughts 0 -  PHQ-9 Score 17 -  Difficult doing work/chores Very difficult -    ASSESSMENT AND PLAN:  Other fatigue - Plan: EKG 12-Lead, Vitamin B12, CBC With Differential, Comprehensive metabolic panel, Folate, Hemoglobin A1c, Insulin, random  Shortness of breath on exertion - Plan: Lipid Panel With LDL/HDL  Ratio  Vitamin D deficiency - Plan: VITAMIN D 25 Hydroxy (Vit-D Deficiency, Fractures)  Other specified hypothyroidism - Plan: T3, T4, free, TSH  Depression with anxiety  At risk for heart disease  Class 2 severe obesity with serious comorbidity and body mass index (BMI) of 39.0 to 39.9 in adult, unspecified obesity type (HCC)  PLAN:  Fatigue [name] was informed that her fatigue may be related to obesity, depression or many other causes. Labs will be ordered, and in the meanwhile [name] has agreed to work on diet, exercise and weight loss to help with fatigue. Proper sleep hygiene was discussed including the need for 7-8 hours of quality sleep each night. A sleep study was not ordered based on symptoms and Epworth score. An EKG and an indirect calorimetry was ordered today and [name] agrees to follow up in 2 weeks.  Dyspnea on exertion [name]'s shortness of breath appears to be obesity related and exercise induced. She has agreed to work on weight loss and gradually increase exercise to treat her exercise induced shortness of breath. If [name] follows our instructions and loses weight without improvement of her shortness of breath, we will plan to refer to pulmonology. We ordered labs, an indirect calorimetry, and an EKG today. We will monitor this condition regularly. Adlih agrees to this plan.  Vitamin D Deficiency [name] was informed that low vitamin D levels contributes to fatigue and are associated with obesity, breast, and colon cancer. A vitamin D level was ordered today and [name] agrees to follow up as directed.  Hypothyroid [name] was informed of the importance of good thyroid control to help with weight loss efforts. She was also informed that supertherapeutic thyroid levels are dangerous and will not improve weight loss results. Labs will be obtained today and she will follow up as agreed.  Cardiovascular risk counseling Mikaya was given extended (15 minutes) coronary artery disease  prevention counseling today. She is 53 y.o. female and has risk factors for heart disease including hypothyroid and obesity. We discussed intensive lifestyle modifications today with an emphasis on specific weight loss instructions and strategies. Pt was also informed of the importance of increasing exercise and decreasing saturated fats to help prevent heart disease.  Depression with Anxiety Elisheva agrees to continue her medications and she will follow up for an office visit in 2 weeks. Myrical was referred to Dr. Dewaine Conger, our bariatric psychologist for evaluation due to elevated PHQ-9 score and significant struggles with emotional eating.  Depression Screen Rayshawna had a strongly positive depression screening. Depression is commonly associated with obesity and often results in emotional eating behaviors. We will monitor this closely and work on CBT to help improve the non-hunger eating patterns. Referral to Psychology may be required if no improvement is seen as she continues in our clinic.  Obesity Drucella is currently in the action stage of change and her goal is to continue with weight loss efforts. I recommend Mariapaz begin the structured treatment plan as follows:  She has agreed to follow the Category 2 plan + 100 calories. Guy has been instructed to eventually work up to a goal of 150 minutes of  combined cardio and strengthening exercise per week for weight loss and overall health benefits. We discussed the following Behavioral Modification Strategies today: increasing lean protein intake and decreasing simple carbohydrates.    She was informed of the importance of frequent follow up visits to maximize her success with intensive lifestyle modifications for her multiple health conditions. She was informed we would discuss her lab results at her next visit unless there is a critical issue that needs to be addressed sooner. Lynn agreed to keep her next visit at the agreed upon time to discuss these  results.  ALLERGIES: Allergies  Allergen Reactions  . Promethazine Hcl Nausea Only    MEDICATIONS: Current Outpatient Medications on File Prior to Visit  Medication Sig Dispense Refill  . b complex vitamins capsule Take 1 capsule by mouth daily.    [name] buPROPion (WELLBUTRIN XL) 150 MG 24 hr tablet Take 150 mg by mouth daily.    . Cholecalciferol (VITAMIN D) 2000 units CAPS Take 2,000 Units by mouth daily.    . citalopram (CELEXA) 40 MG tablet Take 40 mg by mouth daily.  1  . cyproheptadine (PERIACTIN) 4 MG tablet 1/2 to 1 tablet at bedtime 60 tablet 5  . levothyroxine (SYNTHROID, LEVOTHROID) 75 MCG tablet Take 75 mcg by mouth daily.  0  . metoprolol tartrate (LOPRESSOR) 25 MG tablet Take 12.5 mg by mouth at bedtime.     . montelukast (SINGULAIR) 10 MG tablet TAKE ONE TABLET BY MOUTH EVERY NIGHT AT BEDTIME 90 tablet 0  . Omega-3 Fatty Acids (FISH OIL) 1000 MG CAPS Take 1 capsule by mouth daily.    [name] omeprazole (PRILOSEC) 40 MG capsule TAKE ONE CAPSULE BY MOUTH DAILY 90 capsule 2  . Probiotic Product (PROBIOTIC DAILY PO) Take 1 capsule by mouth daily.     No current facility-administered medications on file prior to visit.     PAST MEDICAL HISTORY: Past Medical History:  Diagnosis Date  . ADD (attention deficit disorder)   . Allergies   . Anxiety   . Back pain   . Brachioradial pruritus   . CFS (chronic fatigue syndrome)   . Depression   . GERD (gastroesophageal reflux disease)   . Hypothyroid   . Joint pain   . Lower extremity edema   . Recurrent upper respiratory infection (URI)   . SVT (supraventricular tachycardia) (HCC) 10/19/2015  . Tachycardia   . Vitamin D deficiency     PAST SURGICAL HISTORY: Past Surgical History:  Procedure Laterality Date  . ADENOIDECTOMY    . BLADDER REPAIR    . CESAREAN SECTION    . LAPAROSCOPY    . myomectory      SOCIAL HISTORY: Social History   Tobacco Use  . Smoking status: Never Smoker  . Smokeless tobacco: Never Used   Substance Use Topics  . Alcohol use: No  . Drug use: No    FAMILY HISTORY: Family History  Problem Relation Age of Onset  . Cancer Father   . Diabetes Father   . Heart disease Father   . Hyperlipidemia Father   . Hypertension Father   . Depression Father   . Obesity Father   . Cancer Maternal Grandmother   . Dementia Maternal Grandmother   . Heart disease Paternal Grandmother   . Hypertension Paternal Grandmother   . Diabetes Paternal Grandfather   . Heart disease Paternal Grandfather   . Hyperlipidemia Paternal Grandfather   . Hypertension Paternal Grandfather   . COPD Mother   .  Asthma Mother   . Allergic rhinitis Mother   . Hypertension Mother   . Depression Mother   . Obesity Mother   . Alcoholism Mother   . Drug abuse Mother   . Allergic rhinitis Sister   . Liver disease Maternal Grandfather   . Cancer Maternal Grandfather   . Allergic rhinitis Sister   . Angioedema Neg Hx   . Eczema Neg Hx   . Urticaria Neg Hx     ROS: Review of Systems  Constitutional: Positive for malaise/fatigue. Negative for weight loss.  HENT: Positive for nosebleeds and sinus pain.        Positive for nasal stuffiness. Positive for nasal discharge. Positive for hay fever. Positive for dry mouth. Positive for hoarseness.  Eyes:       Wears glasses or contacts.  Respiratory: Positive for shortness of breath ( with activity).   Cardiovascular: Positive for palpitations.       Positive for calf, leg, and feet pain with walking.  Gastrointestinal: Positive for heartburn. Negative for nausea and vomiting.  Musculoskeletal: Positive for back pain, joint pain and myalgias.       Positive for neck stiffness. Positive for muscle stiffness. Negative for muscle weakness.  Skin: Positive for itching.       Positive for dryness.  Neurological:       Positive for vertigo.  Psychiatric/Behavioral: Positive for depression. The patient has insomnia.        Positive for stress.     PHYSICAL EXAM: Blood pressure (!) 141/76, pulse 67, temperature 98.4 F (36.9 C), temperature source Oral, height 5' (1.524 m), weight 200 lb (90.7 kg), last menstrual period 07/22/2018, SpO2 100 %. Body mass index is 39.06 kg/m. Physical Exam Vitals signs reviewed.  Constitutional:      Appearance: Normal appearance. She is obese.  HENT:     Head: Normocephalic and atraumatic.     Nose: Nose normal.  Eyes:     General: No scleral icterus.    Extraocular Movements: Extraocular movements intact.  Neck:     Musculoskeletal: Normal range of motion and neck supple.     Thyroid: No thyromegaly.     Comments: Negative for thyromegaly. Cardiovascular:     Rate and Rhythm: Normal rate and regular rhythm.  Pulmonary:     Effort: Pulmonary effort is normal. No respiratory distress.  Abdominal:     Palpations: Abdomen is soft.     Tenderness: There is no abdominal tenderness.     Comments: Positive for obesity.  Musculoskeletal:     Comments: ROM normal in all extremities.  Skin:    General: Skin is warm and dry.  Neurological:     Mental Status: She is alert and oriented to person, place, and time.     Coordination: Coordination normal.  Psychiatric:        Mood and Affect: Mood normal.        Behavior: Behavior normal.     RECENT LABS AND TESTS: BMET    Component Value Date/Time   NA 140 02/22/2017 1704   K 4.5 02/22/2017 1704   CL 104 02/22/2017 1704   CO2 26 02/22/2017 1704   GLUCOSE 115 (H) 02/22/2017 1704   BUN 18 02/22/2017 1704   CREATININE 0.78 02/22/2017 1704   CALCIUM 9.5 02/22/2017 1704   GFRNONAA >60 02/22/2017 1704   GFRAA >60 02/22/2017 1704   No results found for: HGBA1C No results found for: INSULIN CBC    Component Value Date/Time  WBC 9.5 02/22/2017 1704   RBC 4.67 02/22/2017 1704   HGB 13.4 02/22/2017 1704   HCT 38.5 02/22/2017 1704   PLT 242 02/22/2017 1704   MCV 82.4 02/22/2017 1704   MCH 28.7 02/22/2017 1704   MCHC 34.8 02/22/2017  1704   RDW 13.8 02/22/2017 1704   LYMPHSABS 1.4 02/07/2015 1606   MONOABS 0.6 02/07/2015 1606   EOSABS 0.1 02/07/2015 1606   BASOSABS 0.0 02/07/2015 1606   Iron/TIBC/Ferritin/ %Sat No results found for: IRON, TIBC, FERRITIN, IRONPCTSAT Lipid Panel     Component Value Date/Time   CHOL  02/16/2008 0400    151        ATP III CLASSIFICATION:  <200     mg/dL   Desirable  [id number]  mg/dL   Borderline High  >=045>=240    mg/dL   High   TRIG 67 40/98/119108/08/2007 0400   HDL 35 (L) 02/16/2008 0400   CHOLHDL 4.3 02/16/2008 0400   VLDL 13 02/16/2008 0400   LDLCALC (H) 02/16/2008 0400    103        Total Cholesterol/HDL:CHD Risk Coronary Heart Disease Risk Table                     Men   Women  1/2 Average Risk   3.4   3.3   Hepatic Function Panel     Component Value Date/Time   PROT 7.2 12/17/2016 2203   ALBUMIN 4.0 12/17/2016 2203   AST 20 12/17/2016 2203   ALT 15 12/17/2016 2203   ALKPHOS 69 12/17/2016 2203   BILITOT 0.5 12/17/2016 2203   BILIDIR 0.1 01/27/2008 0943      Component Value Date/Time   TSH     TSH 3.77 01/27/2008 0943    ECG  shows NSR with a rate of 74 BPM. INDIRECT CALORIMETER done today shows a VO2 of 233 and a REE of 1621.  Her calculated basal metabolic rate is [id number] thus her basal metabolic rate is better than expected.   OBESITY BEHAVIORAL INTERVENTION VISIT  Today's visit was # 1   Starting weight: 200 lbs Starting date: 09/04/18 Today's weight : Weight: 200 lb (90.7 kg)  Today's date: 09/04/2018 Total lbs lost to date: 0  ASK: We discussed the diagnosis of obesity with Monica Hoffanya Ellis today and [name] agreed to give us permission to discuss obesity behavioral modification therapy today.  ASSESS: [name] has the diagnosis of obesity and her BMI today is 39.0. [name] is in the action stage of change.   ADVISE: [name] was educated on the multiple health risks of obesity as well as the benefit of weight loss to improve her health. She was advised of the need for  long term treatment and the importance of lifestyle modifications to improve her current health and to decrease her risk of future health problems.  AGREE: Multiple dietary modification options and treatment options were discussed and Jasilyn agreed to follow the recommendations documented in the above note.  ARRANGE: [name] was educated on the importance of frequent visits to treat obesity as outlined per CMS and USPSTF guidelines and agreed to schedule her next follow up appointment today.  IKirke Corin, Cara Soares, CMA, am acting as transcriptionist for Wilder Gladearen D. Zully Frane, MD   I have reviewed the above documentation for accuracy and completeness, and I agree with the above. -Quillian Quincearen Sherryll Skoczylas, MD

## 2018-09-05 ENCOUNTER — Other Ambulatory Visit: Payer: Self-pay | Admitting: Allergy and Immunology

## 2018-09-05 DIAGNOSIS — F5101 Primary insomnia: Secondary | ICD-10-CM | POA: Diagnosis not present

## 2018-09-05 DIAGNOSIS — F331 Major depressive disorder, recurrent, moderate: Secondary | ICD-10-CM | POA: Diagnosis not present

## 2018-09-05 DIAGNOSIS — F411 Generalized anxiety disorder: Secondary | ICD-10-CM | POA: Diagnosis not present

## 2018-09-05 LAB — COMPREHENSIVE METABOLIC PANEL
ALT: 25 IU/L (ref 0–32)
AST: 19 IU/L (ref 0–40)
Albumin/Globulin Ratio: 1.6 (ref 1.2–2.2)
Albumin: 4.1 g/dL (ref 3.8–4.9)
Alkaline Phosphatase: 85 IU/L (ref 39–117)
BUN/Creatinine Ratio: 19 (ref 9–23)
BUN: 12 mg/dL (ref 6–24)
Bilirubin Total: 0.3 mg/dL (ref 0.0–1.2)
CO2: 25 mmol/L (ref 20–29)
Calcium: 9 mg/dL (ref 8.7–10.2)
Chloride: 103 mmol/L (ref 96–106)
Creatinine, Ser: 0.63 mg/dL (ref 0.57–1.00)
GFR calc Af Amer: 119 mL/min/{1.73_m2} (ref >59)
GFR calc non Af Amer: 103 mL/min/{1.73_m2} (ref >59)
Globulin, Total: 2.6 g/dL (ref 1.5–4.5)
Glucose: 85 mg/dL (ref 65–99)
Potassium: 4.6 mmol/L (ref 3.5–5.2)
Sodium: 142 mmol/L (ref 134–144)
Total Protein: 6.7 g/dL (ref 6.0–8.5)

## 2018-09-05 LAB — CBC WITH DIFFERENTIAL
Basophils Absolute: 0 10*3/uL (ref 0.0–0.2)
Basos: 1 %
EOS (ABSOLUTE): 0.3 10*3/uL (ref 0.0–0.4)
Eos: 8 %
Hematocrit: 32.3 % — ABNORMAL LOW (ref 34.0–46.6)
Hemoglobin: 11.1 g/dL (ref 11.1–15.9)
IMMATURE GRANULOCYTES: 0 %
Immature Grans (Abs): 0 10*3/uL (ref 0.0–0.1)
Lymphocytes Absolute: 0.9 10*3/uL (ref 0.7–3.1)
Lymphs: 23 %
MCH: 28.6 pg (ref 26.6–33.0)
MCHC: 34.4 g/dL (ref 31.5–35.7)
MCV: 83 fL (ref 79–97)
Monocytes Absolute: 0.6 10*3/uL (ref 0.1–0.9)
Monocytes: 13 %
NEUTROS PCT: 55 %
Neutrophils Absolute: 2.3 10*3/uL (ref 1.4–7.0)
RBC: 3.88 x10E6/uL (ref 3.77–5.28)
RDW: 14.1 % (ref 11.7–15.4)
WBC: 4.1 10*3/uL (ref 3.4–10.8)

## 2018-09-05 LAB — LIPID PANEL WITH LDL/HDL RATIO
Cholesterol, Total: 159 mg/dL (ref 100–199)
HDL: 40 mg/dL (ref >39)
LDL Calculated: 98 mg/dL (ref 0–99)
LDl/HDL Ratio: 2.5 ratio (ref 0.0–3.2)
Triglycerides: 103 mg/dL (ref 0–149)
VLDL Cholesterol Cal: 21 mg/dL (ref 5–40)

## 2018-09-05 LAB — INSULIN, RANDOM: INSULIN: 14.7 u[IU]/mL (ref 2.6–24.9)

## 2018-09-05 LAB — FOLATE: Folate: 7.3 ng/mL (ref >3.0)

## 2018-09-05 LAB — HEMOGLOBIN A1C
ESTIMATED AVERAGE GLUCOSE: 103 mg/dL
HEMOGLOBIN A1C: 5.2 % (ref 4.8–5.6)

## 2018-09-05 LAB — VITAMIN D 25 HYDROXY (VIT D DEFICIENCY, FRACTURES): Vit D, 25-Hydroxy: 38 ng/mL (ref 30.0–100.0)

## 2018-09-05 LAB — TSH: TSH: 1.52 u[IU]/mL (ref 0.450–4.500)

## 2018-09-05 LAB — T3: T3, Total: 117 ng/dL (ref 71–180)

## 2018-09-05 LAB — T4, FREE: Free T4: 1.27 ng/dL (ref 0.82–1.77)

## 2018-09-05 LAB — VITAMIN B12: VITAMIN B 12: 590 pg/mL (ref 232–1245)

## 2018-09-18 ENCOUNTER — Encounter (INDEPENDENT_AMBULATORY_CARE_PROVIDER_SITE_OTHER): Payer: Self-pay | Admitting: Family Medicine

## 2018-09-18 ENCOUNTER — Ambulatory Visit (INDEPENDENT_AMBULATORY_CARE_PROVIDER_SITE_OTHER): Payer: BLUE CROSS/BLUE SHIELD | Admitting: Family Medicine

## 2018-09-18 ENCOUNTER — Ambulatory Visit (INDEPENDENT_AMBULATORY_CARE_PROVIDER_SITE_OTHER): Payer: BLUE CROSS/BLUE SHIELD | Admitting: Psychology

## 2018-09-18 VITALS — BP 126/76 | HR 60 | Temp 97.6°F | Ht 60.0 in | Wt 196.0 lb

## 2018-09-18 DIAGNOSIS — E8881 Metabolic syndrome: Secondary | ICD-10-CM | POA: Diagnosis not present

## 2018-09-18 DIAGNOSIS — E559 Vitamin D deficiency, unspecified: Secondary | ICD-10-CM | POA: Diagnosis not present

## 2018-09-18 DIAGNOSIS — F3289 Other specified depressive episodes: Secondary | ICD-10-CM | POA: Diagnosis not present

## 2018-09-18 DIAGNOSIS — Z9189 Other specified personal risk factors, not elsewhere classified: Secondary | ICD-10-CM | POA: Diagnosis not present

## 2018-09-18 DIAGNOSIS — Z6838 Body mass index (BMI) 38.0-38.9, adult: Secondary | ICD-10-CM

## 2018-09-18 MED ORDER — VITAMIN D (ERGOCALCIFEROL) 1.25 MG (50000 UNIT) PO CAPS: 50000.0000 [IU] | ORAL_CAPSULE | ORAL | 0 refills | Status: DC
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
## 2018-09-19 NOTE — Progress Notes (Signed)
Office: [phone]  /  Fax: [phone]   HPI:   Chief Complaint: OBESITY [name] is here to discuss her progress with her obesity treatment plan. She is on the Category 2 plan + 100 calories and is following her eating plan approximately 50 % of the time. She states she is walking the dogs for 60 minutes 7 times per week. [name] did well with weight loss on her Category 2 plan. She noted increased afternoon hunger. She noted deviating from her plan and struggled to eat all of her protein.  Her weight is 196 lb (88.9 kg) today and has had a weight loss of 4 pounds over a period of 2 weeks since her last visit. She has lost 4 lbs since starting treatment with [place].  Vitamin D Deficiency [name] has a diagnosis of vitamin D deficiency. She is on OTC Vit D, but level is not at goal. She denies nausea, vomiting or muscle weakness.  Insulin Resistance [name] has a new diagnosis of insulin resistance based on her elevated fasting insulin level >5. Although [name]'s blood glucose readings are still under good control, insulin resistance puts her at greater risk of metabolic syndrome and diabetes. She is not taking metformin currently and notes polyphagia especially in the afternoons. She continues to work on diet and exercise to decrease risk of diabetes.  At risk for diabetes [name] is at higher than average risk for developing diabetes due to her obesity and insulin resistance. She currently denies polyuria or polydipsia.  ASSESSMENT AND PLAN:  Vitamin D deficiency - Plan: Vitamin D, Ergocalciferol, (DRISDOL) 1.25 MG (50000 UT) CAPS capsule  Insulin resistance  At risk for diabetes mellitus  Class 2 severe obesity with serious comorbidity and body mass index (BMI) of 38.0 to 38.9 in adult, unspecified obesity type (HCC)  PLAN:  Vitamin D Deficiency [name] was informed that low vitamin D levels contributes to fatigue and are associated with obesity, breast, and colon cancer. [name] agrees to  start prescription Vit D ,000 IU every week #4 with no refill, and will hold OTC Vit D. She will follow up for routine testing of vitamin D, at least 2-3 times per year. She was informed of the risk of over-replacement of vitamin D and agrees to not increase her dose unless she discusses this with [place] first. We will recheck labs in 3 months. [name] agrees to follow up with our clinic in 2 to 3 weeks.  Insulin Resistance [name] will continue to work on weight loss, diet, exercise, and decreasing simple carbohydrates in her diet to help decrease the risk of diabetes. We dicussed metformin including benefits and risks. She was informed that eating too many simple carbohydrates or too many calories at one sitting increases the likelihood of GI side effects. Tierney declined metformin for now and prescription was not written today. We will recheck labs in 3 months. [name] agrees to follow up with our clinic in 2 to 3 weeks as directed to monitor her progress.  Diabetes risk counselling [name] was given extended (30 minutes) diabetes prevention counseling today. She is 53 y.o. female and has risk factors for diabetes including obesity and insulin resistance. We discussed intensive lifestyle modifications today with an emphasis on weight loss as well as increasing exercise and decreasing simple carbohydrates in her diet.  Obesity [name] is currently in the action stage of change. As such, her goal is to continue with weight loss efforts She has agreed to follow the Category 2 plan + 100  calories [name] has been instructed to work up to a goal of 150 minutes of combined cardio and strengthening exercise per week for weight loss and overall health benefits. We discussed the following Behavioral Modification Strategies today: increasing lean protein, decreasing simple carbohydrates, increasing vegetables, meal planning & cooking strategies, and better snacking choices  [name] has agreed to follow up with our clinic in  2 to 3 weeks. She was informed of the importance of frequent follow up visits to maximize her success with intensive lifestyle modifications for her multiple health conditions.  ALLERGIES: Allergies  Allergen Reactions  . Promethazine Hcl Nausea Only    MEDICATIONS: Current Outpatient Medications on File Prior to Visit  Medication Sig Dispense Refill  . b complex vitamins capsule Take 1 capsule by mouth daily.    [name] buPROPion (WELLBUTRIN XL) 150 MG 24 hr tablet Take 150 mg by mouth daily.    . Cholecalciferol (VITAMIN D) 2000 units CAPS Take 2,000 Units by mouth daily.    . citalopram (CELEXA) 40 MG tablet Take 40 mg by mouth daily.  1  . cyproheptadine (PERIACTIN) 4 MG tablet 1/2 to 1 tablet at bedtime 60 tablet 5  . levothyroxine (SYNTHROID, LEVOTHROID) 75 MCG tablet Take 75 mcg by mouth daily.  0  . metoprolol tartrate (LOPRESSOR) 25 MG tablet Take 12.5 mg by mouth at bedtime.     . montelukast (SINGULAIR) 10 MG tablet TAKE ONE TABLET BY MOUTH EVERY NIGHT AT BEDTIME 90 tablet 0  . Omega-3 Fatty Acids (FISH OIL) 1000 MG CAPS Take 1 capsule by mouth daily.    [name] omeprazole (PRILOSEC) 40 MG capsule TAKE ONE CAPSULE BY MOUTH DAILY 90 capsule 2  . Probiotic Product (PROBIOTIC DAILY PO) Take 1 capsule by mouth daily.     No current facility-administered medications on file prior to visit.     PAST MEDICAL HISTORY: Past Medical History:  Diagnosis Date  . ADD (attention deficit disorder)   . Allergies   . Anxiety   . Back pain   . Brachioradial pruritus   . CFS (chronic fatigue syndrome)   . Depression   . GERD (gastroesophageal reflux disease)   . Hypothyroid   . Joint pain   . Lower extremity edema   . Recurrent upper respiratory infection (URI)   . SVT (supraventricular tachycardia) (HCC) 10/19/2015  . Tachycardia   . Vitamin D deficiency     PAST SURGICAL HISTORY: Past Surgical History:  Procedure Laterality Date  . ADENOIDECTOMY    . BLADDER REPAIR    . CESAREAN  SECTION    . LAPAROSCOPY    . myomectory      SOCIAL HISTORY: Social History   Tobacco Use  . Smoking status: Never Smoker  . Smokeless tobacco: Never Used  Substance Use Topics  . Alcohol use: No  . Drug use: No    FAMILY HISTORY: Family History  Problem Relation Age of Onset  . Cancer Father   . Diabetes Father   . Heart disease Father   . Hyperlipidemia Father   . Hypertension Father   . Depression Father   . Obesity Father   . Cancer Maternal Grandmother   . Dementia Maternal Grandmother   . Heart disease Paternal Grandmother   . Hypertension Paternal Grandmother   . Diabetes Paternal Grandfather   . Heart disease Paternal Grandfather   . Hyperlipidemia Paternal Grandfather   . Hypertension Paternal Grandfather   . COPD Mother   . Asthma Mother   .  Allergic rhinitis Mother   . Hypertension Mother   . Depression Mother   . Obesity Mother   . Alcoholism Mother   . Drug abuse Mother   . Allergic rhinitis Sister   . Liver disease Maternal Grandfather   . Cancer Maternal Grandfather   . Allergic rhinitis Sister   . Angioedema Neg Hx   . Eczema Neg Hx   . Urticaria Neg Hx     ROS: Review of Systems  Constitutional: Positive for weight loss.  Gastrointestinal: Negative for nausea and vomiting.  Genitourinary: Negative for frequency.  Musculoskeletal:       Negative muscle weakness  Endo/Heme/Allergies: Negative for polydipsia.       Positive polyphagia    PHYSICAL EXAM: Blood pressure 126/76, pulse 60, temperature 97.6 F (36.4 C), temperature source Oral, height 5' (1.524 m), weight 196 lb (88.9 kg), SpO2 98 %. Body mass index is 38.28 kg/m. Physical Exam Vitals signs reviewed.  Constitutional:      Appearance: Normal appearance. She is obese.  Cardiovascular:     Rate and Rhythm: Normal rate.     Pulses: Normal pulses.  Pulmonary:     Effort: Pulmonary effort is normal.     Breath sounds: Normal breath sounds.  Musculoskeletal: Normal range  of motion.  Skin:    General: Skin is warm and dry.  Neurological:     Mental Status: She is alert and oriented to person, place, and time.  Psychiatric:        Mood and Affect: Mood normal.        Behavior: Behavior normal.     RECENT LABS AND TESTS: BMET    Component Value Date/Time   NA 142 09/04/2018 1318   K 4.6 09/04/2018 1318   CL 103 09/04/2018 1318   CO2 25 09/04/2018 1318   GLUCOSE 85 09/04/2018 1318   GLUCOSE 115 (H) 02/22/2017 1704   BUN 12 09/04/2018 1318   CREATININE 0.63 09/04/2018 1318   CALCIUM 9.0 09/04/2018 1318   GFRNONAA 103 09/04/2018 1318   GFRAA 119 09/04/2018 1318   Lab Results  Component Value Date   HGBA1C 5.2 09/04/2018   Lab Results  Component Value Date   INSULIN 14.7 09/04/2018   CBC    Component Value Date/Time   WBC 4.1 09/04/2018 1318   WBC 9.5 02/22/2017 1704   RBC 3.88 09/04/2018 1318   RBC 4.67 02/22/2017 1704   HGB 11.1 09/04/2018 1318   HCT 32.3 (L) 09/04/2018 1318   PLT 242 02/22/2017 1704   MCV 83 09/04/2018 1318   MCH 28.6 09/04/2018 1318   MCH 28.7 02/22/2017 1704   MCHC 34.4 09/04/2018 1318   MCHC 34.8 02/22/2017 1704   RDW 14.1 09/04/2018 1318   LYMPHSABS 0.9 09/04/2018 1318   MONOABS 0.6 02/07/2015 1606   EOSABS 0.3 09/04/2018 1318   BASOSABS 0.0 09/04/2018 1318   Iron/TIBC/Ferritin/ %Sat No results found for: IRON, TIBC, FERRITIN, IRONPCTSAT Lipid Panel     Component Value Date/Time   CHOL 159 09/04/2018 1318   TRIG 103 09/04/2018 1318   HDL 40 09/04/2018 1318   CHOLHDL 4.3 02/16/2008 0400   VLDL 13 02/16/2008 0400   LDLCALC 98 09/04/2018 1318   Hepatic Function Panel     Component Value Date/Time   PROT 6.7 09/04/2018 1318   ALBUMIN 4.1 09/04/2018 1318   AST 19 09/04/2018 1318   ALT 25 09/04/2018 1318   ALKPHOS 85 09/04/2018 1318   BILITOT 0.3 09/04/2018 1318  BILIDIR 0.1 01/27/2008 0943       OBESITY BEHAVIORAL INTERVENTION VISIT  Today's visit was # 2   Starting weight: 200  lbs Starting date: 09/04/2018 Today's weight : 196 lbs  Today's date: 09/18/2018 Total lbs lost to date: 4    09/18/2018  Height 5' (1.524 m)  Weight 196 lb (88.9 kg)  BMI (Calculated) 38.28  BLOOD PRESSURE - SYSTOLIC 126  BLOOD PRESSURE - DIASTOLIC 76   Body Fat % 46.8 %  Total Body Water (lbs) 81.6 lbs     ASK: We discussed the diagnosis of obesity with [name] today and [name] agreed to give [place] permission to discuss obesity behavioral modification therapy today.  ASSESS: [name] has the diagnosis of obesity and her BMI today is 38.28 [name] is in the action stage of change   ADVISE: [name] was educated on the multiple health risks of obesity as well as the benefit of weight loss to improve her health. She was advised of the need for long term treatment and the importance of lifestyle modifications to improve her current health and to decrease her risk of future health problems.  AGREE: Multiple dietary modification options and treatment options were discussed and  [name] agreed to follow the recommendations documented in the above note.  ARRANGE: [name] was educated on the importance of frequent visits to treat obesity as outlined per CMS and USPSTF guidelines and agreed to schedule her next follow up appointment today.  [name], [name], am acting as transcriptionist for Monica Quince, MD  [name] have reviewed the above documentation for accuracy and completeness, and [name] agree with the above. -Monica Quince, MD

## 2018-09-25 ENCOUNTER — Telehealth (INDEPENDENT_AMBULATORY_CARE_PROVIDER_SITE_OTHER): Payer: Self-pay | Admitting: Family Medicine

## 2018-09-25 ENCOUNTER — Encounter (INDEPENDENT_AMBULATORY_CARE_PROVIDER_SITE_OTHER): Payer: Self-pay | Admitting: Family Medicine

## 2018-09-25 NOTE — Telephone Encounter (Signed)
The patient stated she went to donate blood and could not because her Iron was too low. It was 11.5 then second time 12.4.  The patient wants to know if she can take an Iron Supplement?  Will it interfere with the Vitamin D she is currently taking?  If it is ok? Is there a recommendation of Brand and strength of Iron.   Please call her back on her cell.

## 2018-10-03 NOTE — Progress Notes (Addendum)
Office: [phone]  /  Fax: [phone]    Date: October 08, 2018   Appointment Start Time: 4:26pm Duration: 34 minutes Provider: Lawerance Cruel, Psy.D. Type of Session: Individual Therapy  Location of Patient: Home Location of Provider: Office Type of Contact: Telepsychology Visit via [employer]  Session Content: Prior to initiating telepsychological services, Temica was provided with an informed consent document, which included the development of a safety plan in the event of an emergency/crisis. It also addressed that Ayako is ultimately responsible for understanding her insurance benefits as it relates to reimbursement of telepsychological services. Gaylia provided written informed consent prior to proceeding. Notably, due to COVID-19, the signed consent was returned to this provider via a MyChart message as the clinic is closed. Wisdom acknowledged understanding that all MyChart messages are visible to all providers as they are part of the electronic medical record. In addition, this provider explained the telepsychological services informed consent document would be considered an addendum to that initial consent document.   Today's appointment was a telepsychology visit as this provider's clinic is closed for in-person visits due to COVID-19. Therapeutic services will resume to in-person appointments once the clinic re-opens. Venice expressed understanding regarding the rationale for telepsychological services, and consented to proceed. Additionally, prior to proceeding with today's appointment, Railee's physical location at the time of this appointment was obtained. Whitlee reported she was at [address] [place], which is Rogina's home. In the event of technical difficulties, Giuliana stated she can be reached at the following phone number: [phone]. The participants for today's telepsychological service were this provider and Janasia. Notably, at one point her son opened the door  to her bedroom to ask a question; however, Liylah informed him she was on the phone for an appointment, and he left. Aside from the aforementioned, Zissel was the only one present in her bedroom.   Prayer is a 53 y.o. female presenting via [employer] for a follow-up appointment to address the previously established treatment goal of decreasing emotional eating. The session was initiated with a verbal administration of the PHQ-9 and GAD-7, as well as a brief check-in. Andjela explained she endorsed items on the PHQ-9 and GAD-7 due to the change in her routine secondary to the coronavirus. This provider normalized the aforementioned. She also reported she has been "congested for weeks." Lindsie indicated she messaged her primary care physician and is waiting back for an answer. Regarding eating, she denied snacking and engaging in mindless eating, but described emotional eating.  She also explained she is following the structured meal plan 85% of the time.  Moreover, Vila explained that due to a change in her sleep schedule, she is skipping lunch. She also shared she cried today as she is is experiencing anxiety due to her son being home, and explained, "I am going to fail him by not being able to teach him." As such, this provider briefly engaged Latreva in problem solving to determine a plan to further assist her son and reduce her subsequent worry. She endorsed a plan to contact his therapist as well as teachers. Furthermore, psychoeducation regarding triggers for emotional eating was provided. Melvine was provided a handout via Mychart, and encouraged to utilize the handout between now and the next appointment to increase awareness of triggers and frequency. Mindel agreed. This provider also discussed behavioral strategies for specific triggers, such as placing the utensil down when conversing to avoid mindless eating. She also requested this provider send the handout for emotional  versus physical hunger via my chart.  Notably, prior to sending the message, this provider explained the message would be visible to all providers, as it would be part of the electronic medical record. Zahniya verbally acknowledged understanding, and verbally consented to this provider sending the MyChart message with the aforementioned handouts. Session concluded with Celester sharing she received a call based on the referral placed by this provider. She noted, "I am going to wait" and explained she plans to wait to schedule after concerns related to COVID-19 pass. She further added, "I am okay." Faythe was receptive to today's session as evidenced by openness to sharing, responsiveness to feedback, and willingness to explore triggers for emotional eating.  Mental Status Examination:  Appearance: neat Behavior: cooperative Mood: euthymic Affect: mood congruent Speech: normal in rate, volume, and tone Eye Contact: appropriate Psychomotor Activity: appropriate Thought Process: linear, logical, and goal directed  Content/Perceptual Disturbances: denies suicidal and homicidal ideation, plan, and intent and no hallucinations, delusions, bizarre thinking or behavior reported or observed Orientation: time, person, place and purpose of appointment Cognition/Sensorium: memory, attention, language, and fund of knowledge intact  Insight: good Judgment: good  Structured Assessment Results: The Patient Health Questionnaire-9 (PHQ-9) is a self-report measure that assesses symptoms and severity of depression over the course of the last two weeks. Sanye obtained a score of 9 suggesting mild depression. Anuoluwa finds the endorsed symptoms to be somewhat difficult. Depression screen [facility] 2/9 10/08/2018  Decreased Interest 3  Down, Depressed, Hopeless 2  PHQ - 2 Score 5  Altered sleeping 3  Tired, decreased energy 0  Change in appetite 0  Feeling bad or failure about yourself  0  Trouble concentrating 1  Moving slowly or fidgety/restless 0  Suicidal  thoughts 0  PHQ-9 Score 9  Difficult doing work/chores -   The Generalized Anxiety Disorder-7 (GAD-7) is a brief self-report measure that assesses symptoms of anxiety over the course of the last two weeks. Edelin obtained a score of 15 suggesting severe anxiety. GAD 7 : Generalized Anxiety Score 10/08/2018  Nervous, Anxious, on Edge 3  Control/stop worrying 3  Worry too much - different things 3  Trouble relaxing 0  Restless 0  Easily annoyed or irritable 3  Afraid - awful might happen 3  Total GAD 7 Score 15  Anxiety Difficulty Not difficult at all   Interventions:  Administration of PHQ-9 and GAD-7 for symptom monitoring Review of content from the previous session Empathic reflections and validation Engaged Cartina in problem solving Normalized current concerns related to COVID-19 Psychoeducation regarding triggers for emotional eating Positive reinforcement Rapport building Brief chart review  DSM-5 Diagnosis: 311 (F32.8) Other Specified Depressive Disorder, Emotional Eating Behaviors  Treatment Goal & Progress: During the initial appointment with this provider, the following treatment goal was established: decrease emotional eating. Progress is limited, as Catalea has just begun treatment with this provider; however, she is receptive to the interaction and interventions and rapport is being established.    Plan: Ensley continues to appear able and willing to participate as evidenced by engagement in reciprocal conversation, and asking questions for clarification as appropriate. The next appointment will be scheduled in two weeks. Notably, the next appointment will likely be via [facility], which Emariah was agreeable to. She will be contacted by this provider's clinic should anything change. The next session will focus on reviewing triggers for emotional eating, and the introduction to mindfulness.

## 2018-10-07 ENCOUNTER — Encounter (INDEPENDENT_AMBULATORY_CARE_PROVIDER_SITE_OTHER): Payer: Self-pay | Admitting: Family Medicine

## 2018-10-08 ENCOUNTER — Encounter (INDEPENDENT_AMBULATORY_CARE_PROVIDER_SITE_OTHER): Payer: Self-pay

## 2018-10-08 ENCOUNTER — Other Ambulatory Visit: Payer: Self-pay

## 2018-10-08 ENCOUNTER — Ambulatory Visit (INDEPENDENT_AMBULATORY_CARE_PROVIDER_SITE_OTHER): Payer: BLUE CROSS/BLUE SHIELD | Admitting: Psychology

## 2018-10-08 ENCOUNTER — Ambulatory Visit (INDEPENDENT_AMBULATORY_CARE_PROVIDER_SITE_OTHER): Payer: BLUE CROSS/BLUE SHIELD | Admitting: Family Medicine

## 2018-10-08 ENCOUNTER — Encounter (INDEPENDENT_AMBULATORY_CARE_PROVIDER_SITE_OTHER): Payer: Self-pay | Admitting: Family Medicine

## 2018-10-08 DIAGNOSIS — Z6838 Body mass index (BMI) 38.0-38.9, adult: Secondary | ICD-10-CM | POA: Diagnosis not present

## 2018-10-08 DIAGNOSIS — E559 Vitamin D deficiency, unspecified: Secondary | ICD-10-CM

## 2018-10-08 DIAGNOSIS — F418 Other specified anxiety disorders: Secondary | ICD-10-CM

## 2018-10-08 DIAGNOSIS — F3289 Other specified depressive episodes: Secondary | ICD-10-CM | POA: Diagnosis not present

## 2018-10-08 MED ORDER — VITAMIN D (ERGOCALCIFEROL) 1.25 MG (50000 UNIT) PO CAPS: 50000.0000 [IU] | ORAL_CAPSULE | ORAL | 0 refills | Status: DC

## 2018-10-09 NOTE — Progress Notes (Signed)
Office: [phone]  /  Fax: [phone] TeleHealth Visit:  Karalee Lesinski has consented to this TeleHealth visit today via telephone due to patients inability to access video. The patient is located at home, the provider is located at the [facility] office. The participants in this visit include the listed provider and patient and provider's assistant.   HPI:   Chief Complaint: OBESITY [name] is here to discuss her progress with her obesity treatment plan. She is on the Category 2 plan + 100 calories and is following her eating plan approximately 85 % of the time. She states she is exercising 0 minutes 0 times per week. Yarixa is struggling to eat her food due to worsening anxiety. She is skipping meals at times, but not doing much comfort eating. She is home during the COVID-19 outbreak, which seems to be worsening her motivation.  We were unable to weight the patient today for this TeleHealth visit. She has not stated if she feels she has lost weight since her last visit. She has lost 4 lbs since starting treatment with [place].  Vitamin D Deficiency Teliah has a diagnosis of vitamin D deficiency. She is stable on prescription Vit D, but level is not yet at goal. She notes fatigue and denies nausea, vomiting or muscle weakness.  Depression with Anxiety Shavona notes increased anxiety while socially isolating at home. She is worried about her husband who is still working and her 18 year old son who is home from school. She is taking Wellbutrin and Celexa regularly. She notes insomnia. She shows no sign of suicidal or homicidal ideations.  ASSESSMENT AND PLAN:  Vitamin D deficiency - Plan: Vitamin D, Ergocalciferol, (DRISDOL) 1.25 MG (50000 UT) CAPS capsule  Depression with anxiety  Class 2 severe obesity with serious comorbidity and body mass index (BMI) of 38.0 to 38.9 in adult, unspecified obesity type (HCC)  PLAN:  Vitamin D Deficiency Morrison was informed that low vitamin D  levels contributes to fatigue and are associated with obesity, breast, and colon cancer. Chatoya agrees to continue taking prescription Vit D @50 ,000 IU every week #4 and we will refill for 1 month. She will follow up for routine testing of vitamin D, at least 2-3 times per year. She was informed of the risk of over-replacement of vitamin D and agrees to not increase her dose unless she discusses this with [place] first. Gao agrees to follow up with our clinic in 2 weeks.  Depression with Anxiety Addaline was offered cognitive behavioral therapy and support. We discussed the importance of staying busy and being productive. She is to talk with Dr. Dewaine Conger, our bariatric psychologist later today.  Obesity Niki is currently in the action stage of change. As such, her goal is to continue with weight loss efforts She has agreed to follow the Category 2 plan Meygan has been instructed to work up to a goal of 150 minutes of combined cardio and strengthening exercise per week for weight loss and overall health benefits. We discussed the following Behavioral Modification Strategies today: increasing lean protein intake, work on meal planning and easy cooking plans, emotional eating strategies, ways to avoid boredom eating, ways to avoid night time snacking, no skipping meals, keeping healthy foods in the home, and planning for success   Windee has agreed to follow up with our clinic in 2 weeks. She was informed of the importance of frequent follow up visits to maximize her success with intensive lifestyle modifications for her multiple health  conditions.  ALLERGIES: Allergies  Allergen Reactions  . Promethazine Hcl Nausea Only    MEDICATIONS: Current Outpatient Medications on File Prior to Visit  Medication Sig Dispense Refill  . b complex vitamins capsule Take 1 capsule by mouth daily.    [name] buPROPion (WELLBUTRIN XL) 150 MG 24 hr tablet Take 150 mg by mouth daily.    . Cholecalciferol (VITAMIN D) 2000 units  CAPS Take 2,000 Units by mouth daily.    . citalopram (CELEXA) 40 MG tablet Take 40 mg by mouth daily.  1  . cyproheptadine (PERIACTIN) 4 MG tablet 1/2 to 1 tablet at bedtime 60 tablet 5  . levothyroxine (SYNTHROID, LEVOTHROID) 75 MCG tablet Take 75 mcg by mouth daily.  0  . metoprolol tartrate (LOPRESSOR) 25 MG tablet Take 12.5 mg by mouth at bedtime.     . montelukast (SINGULAIR) 10 MG tablet TAKE ONE TABLET BY MOUTH EVERY NIGHT AT BEDTIME 90 tablet 0  . Omega-3 Fatty Acids (FISH OIL) 1000 MG CAPS Take 1 capsule by mouth daily.    [name] omeprazole (PRILOSEC) 40 MG capsule TAKE ONE CAPSULE BY MOUTH DAILY 90 capsule 2  . Probiotic Product (PROBIOTIC DAILY PO) Take 1 capsule by mouth daily.     No current facility-administered medications on file prior to visit.     PAST MEDICAL HISTORY: Past Medical History:  Diagnosis Date  . ADD (attention deficit disorder)   . Allergies   . Anxiety   . Back pain   . Brachioradial pruritus   . CFS (chronic fatigue syndrome)   . Depression   . GERD (gastroesophageal reflux disease)   . Hypothyroid   . Joint pain   . Lower extremity edema   . Recurrent upper respiratory infection (URI)   . SVT (supraventricular tachycardia) (HCC) 10/19/2015  . Tachycardia   . Vitamin D deficiency     PAST SURGICAL HISTORY: Past Surgical History:  Procedure Laterality Date  . ADENOIDECTOMY    . BLADDER REPAIR    . CESAREAN SECTION    . LAPAROSCOPY    . myomectory      SOCIAL HISTORY: Social History   Tobacco Use  . Smoking status: Never Smoker  . Smokeless tobacco: Never Used  Substance Use Topics  . Alcohol use: No  . Drug use: No    FAMILY HISTORY: Family History  Problem Relation Age of Onset  . Cancer Father   . Diabetes Father   . Heart disease Father   . Hyperlipidemia Father   . Hypertension Father   . Depression Father   . Obesity Father   . Cancer Maternal Grandmother   . Dementia Maternal Grandmother   . Heart disease Paternal  Grandmother   . Hypertension Paternal Grandmother   . Diabetes Paternal Grandfather   . Heart disease Paternal Grandfather   . Hyperlipidemia Paternal Grandfather   . Hypertension Paternal Grandfather   . COPD Mother   . Asthma Mother   . Allergic rhinitis Mother   . Hypertension Mother   . Depression Mother   . Obesity Mother   . Alcoholism Mother   . Drug abuse Mother   . Allergic rhinitis Sister   . Liver disease Maternal Grandfather   . Cancer Maternal Grandfather   . Allergic rhinitis Sister   . Angioedema Neg Hx   . Eczema Neg Hx   . Urticaria Neg Hx     ROS: Review of Systems  Constitutional: Positive for malaise/fatigue.  Gastrointestinal: Negative for nausea and vomiting.  Musculoskeletal:       Negative muscle weakness  Psychiatric/Behavioral: Positive for depression. Negative for suicidal ideas. The patient has insomnia.        + Anxiety    PHYSICAL EXAM: Pt in no acute distress  RECENT LABS AND TESTS: BMET    Component Value Date/Time   NA 142 09/04/2018 1318   K 4.6 09/04/2018 1318   CL 103 09/04/2018 1318   CO2 25 09/04/2018 1318   GLUCOSE 85 09/04/2018 1318   GLUCOSE 115 (H) 02/22/2017 1704   BUN 12 09/04/2018 1318   CREATININE 0.63 09/04/2018 1318   CALCIUM 9.0 09/04/2018 1318   GFRNONAA 103 09/04/2018 1318   GFRAA 119 09/04/2018 1318   Lab Results  Component Value Date   HGBA1C 5.2 09/04/2018   Lab Results  Component Value Date   INSULIN 14.7 09/04/2018   CBC    Component Value Date/Time   WBC 4.1 09/04/2018 1318   WBC 9.5 02/22/2017 1704   RBC 3.88 09/04/2018 1318   RBC 4.67 02/22/2017 1704   HGB 11.1 09/04/2018 1318   HCT 32.3 (L) 09/04/2018 1318   PLT 242 02/22/2017 1704   MCV 83 09/04/2018 1318   MCH 28.6 09/04/2018 1318   MCH 28.7 02/22/2017 1704   MCHC 34.4 09/04/2018 1318   MCHC 34.8 02/22/2017 1704   RDW 14.1 09/04/2018 1318   LYMPHSABS 0.9 09/04/2018 1318   MONOABS 0.6 02/07/2015 1606   EOSABS 0.3 09/04/2018 1318    BASOSABS 0.0 09/04/2018 1318   Iron/TIBC/Ferritin/ %Sat No results found for: IRON, TIBC, FERRITIN, IRONPCTSAT Lipid Panel     Component Value Date/Time   CHOL 159 09/04/2018 1318   TRIG 103 09/04/2018 1318   HDL 40 09/04/2018 1318   CHOLHDL 4.3 02/16/2008 0400   VLDL 13 02/16/2008 0400   LDLCALC 98 09/04/2018 1318   Hepatic Function Panel     Component Value Date/Time   PROT 6.7 09/04/2018 1318   ALBUMIN 4.1 09/04/2018 1318   AST 19 09/04/2018 1318   ALT 25 09/04/2018 1318   ALKPHOS 85 09/04/2018 1318   BILITOT 0.3 09/04/2018 1318   BILIDIR 0.1 01/27/2008 0943       I, Burt Knack, am acting as transcriptionist for Quillian Quince, MD I have reviewed the above documentation for accuracy and completeness, and I agree with the above. -Quillian Quince, MD

## 2018-10-10 DIAGNOSIS — J0100 Acute maxillary sinusitis, unspecified: Secondary | ICD-10-CM | POA: Diagnosis not present

## 2018-10-10 DIAGNOSIS — R0981 Nasal congestion: Secondary | ICD-10-CM | POA: Diagnosis not present

## 2018-10-12 ENCOUNTER — Other Ambulatory Visit (INDEPENDENT_AMBULATORY_CARE_PROVIDER_SITE_OTHER): Payer: Self-pay | Admitting: Family Medicine

## 2018-10-12 DIAGNOSIS — E559 Vitamin D deficiency, unspecified: Secondary | ICD-10-CM

## 2018-10-19 DIAGNOSIS — M9903 Segmental and somatic dysfunction of lumbar region: Secondary | ICD-10-CM | POA: Diagnosis not present

## 2018-10-19 DIAGNOSIS — M5386 Other specified dorsopathies, lumbar region: Secondary | ICD-10-CM | POA: Diagnosis not present

## 2018-10-19 DIAGNOSIS — M9904 Segmental and somatic dysfunction of sacral region: Secondary | ICD-10-CM | POA: Diagnosis not present

## 2018-10-19 DIAGNOSIS — M5137 Other intervertebral disc degeneration, lumbosacral region: Secondary | ICD-10-CM | POA: Diagnosis not present

## 2018-10-22 ENCOUNTER — Ambulatory Visit (INDEPENDENT_AMBULATORY_CARE_PROVIDER_SITE_OTHER): Payer: BLUE CROSS/BLUE SHIELD | Admitting: Family Medicine

## 2018-10-22 ENCOUNTER — Ambulatory Visit (INDEPENDENT_AMBULATORY_CARE_PROVIDER_SITE_OTHER): Payer: BLUE CROSS/BLUE SHIELD | Admitting: Psychology

## 2018-10-22 ENCOUNTER — Encounter (INDEPENDENT_AMBULATORY_CARE_PROVIDER_SITE_OTHER): Payer: Self-pay | Admitting: Family Medicine

## 2018-10-22 ENCOUNTER — Other Ambulatory Visit: Payer: Self-pay

## 2018-10-22 ENCOUNTER — Encounter (INDEPENDENT_AMBULATORY_CARE_PROVIDER_SITE_OTHER): Payer: Self-pay

## 2018-10-22 DIAGNOSIS — E559 Vitamin D deficiency, unspecified: Secondary | ICD-10-CM | POA: Diagnosis not present

## 2018-10-22 DIAGNOSIS — F3289 Other specified depressive episodes: Secondary | ICD-10-CM

## 2018-10-22 DIAGNOSIS — Z6838 Body mass index (BMI) 38.0-38.9, adult: Secondary | ICD-10-CM

## 2018-10-22 MED ORDER — VITAMIN D (ERGOCALCIFEROL) 1.25 MG (50000 UNIT) PO CAPS: ORAL_CAPSULE | ORAL | 0 refills | Status: DC

## 2018-10-22 NOTE — Progress Notes (Signed)
Office: [phone]  /  Fax: [phone]    Date: Monica 7, 2020   Appointment Start Time: 9:01am Duration: 33 minutes Provider: Lawerance CruelGaytri Tyleek Smick, Psy.D. Type of Session: Individual Therapy  Location of Patient: Home-bedroom  Location of Provider: Office  Type of Contact: Telepsychological Visit via [employer]   Session Content: [name] is a 53 y.o. female presenting via [employer] for a follow-up appointment to address the previously established treatment goal of decreasing emotional eating. Today's appointment was a telepsychological visit, as this provider's clinic is closed for in-person visits due to COVID-19. Therapeutic services will resume to in-person appointments once the clinic re-opens. [name] expressed understanding regarding the rationale for telepsychological services. Prior to proceeding with today's appointment, [name]'s physical location at the time of this appointment was obtained. [name] reported she was at home in her bedroom, and provided the address. In the event of technical difficulties, [name] shared a phone number she could be reached at. [name] and this provider participated in today's telepsychological service. Also, [name] denied anyone else being present in the room or on the [employer] meeting. Notably, her son entered the room on two occassions to ask her questions; however, she asked him to leave and close the door behind him after explaining she was having an appointment. This provider verbally administered the PHQ-9 and GAD-7, and conducted a brief check-in. A decrease in PHQ-9 and GAD-7 scores was reflected to [name]. She shared she feels the decrease in symptomatology and believes it is due to keeping in touch with friends during social distancing. Also, [name] shared she spoke with [name] from this provider's office regarding the referral previously placed. [name] shared she desires to initiate longer-term therapeutic services, but would prefer the first appointment be  in-person. Nevertheless, she reported she would call and schedule a future appointment to avoid the referral being closed. Since the last appointment with this provider, [name] reported things have been "pretty good." She added, "I've kind of gotten used to it [referring to being home]." Regarding eating, she also noted it has been "pretty good." While she discussed occasional snacking in the evenings due to boredom and out of habit, [name] described a decrease in guilt due to portion control. This provider explored this further, and engaged [name] in goal setting in order to use allocated snack calories for evening snacks. [name] was agreeable. Moreover, psychoeducation regarding mindfulness was provided. A handout was provided to [name] via MyChart with further information regarding mindfulness, including exercises. This provider also explained the benefit of mindfulness as it relates to emotional eating. [name] agreed. Additionally, she was led through an exercise involving her senses. After the exercise, [name] shared, "I love it!" Thus, she was encouraged to engage in the exercise at least once a day; she agreed. Prior to sending the MyChart message, this provider explained the message would be visible to all providers, as it would be part of the electronic medical record. [name] verbally acknowledged understanding, and verbally consented to this provider sending the MyChart message. [name] was receptive to today's session as evidenced by openness to sharing, responsiveness to feedback, and willingness to engage in mindfulness.  Mental Status Examination:  Appearance: neat Behavior: cooperative Mood: euthymic Affect: mood congruent Speech: normal in rate, volume, and tone Eye Contact: appropriate Psychomotor Activity: appropriate Thought Process: linear, logical, and goal directed  Content/Perceptual Disturbances: denies suicidal and homicidal ideation, plan, and intent and no hallucinations, delusions,  bizarre thinking or behavior reported or observed Orientation: time, person, place and purpose  of appointment Cognition/Sensorium: memory, attention, language, and fund of knowledge intact  Insight: good Judgment: good  Structured Assessment Results: The Patient Health Questionnaire-9 (PHQ-9) is a self-report measure that assesses symptoms and severity of depression over the course of the last two weeks. [name] obtained a score of 4 suggesting minimal depression. [name] finds the endorsed symptoms to be somewhat difficult. Depression screen PHQ 2/9 10/22/2018  Decreased Interest 1  Down, Depressed, Hopeless 0  PHQ - 2 Score 1  Altered sleeping 1  Tired, decreased energy 1  Change in appetite 0  Feeling bad or failure about yourself  0  Trouble concentrating 1  Moving slowly or fidgety/restless 0  Suicidal thoughts 0  PHQ-9 Score 4  Difficult doing work/chores -   The Generalized Anxiety Disorder-7 (GAD-7) is a brief self-report measure that assesses symptoms of anxiety over the course of the last two weeks. [name] obtained a score of 5 suggesting mild anxiety. GAD 7 : Generalized Anxiety Score 10/22/2018  Nervous, Anxious, on Edge 3  Control/stop worrying 0  Worry too much - different things 0  Trouble relaxing 0  Restless 0  Easily annoyed or irritable 1  Afraid - awful might happen 1  Total GAD 7 Score 5  Anxiety Difficulty Not difficult at all   Interventions:  Administration of PHQ-9 and GAD-7 for symptom monitoring Review of content from the previous session Empathic reflections and validation Psychoeducation regarding mindfulness Mindfulness exercise Positive reinforcement Brief chart review  DSM-5 Diagnosis: 311 (F32.8) Other Specified Depressive Disorder, Emotional Eating Behaviors  Treatment Goal & Progress: During the initial appointment with this provider, the following treatment goal was established: decrease emotional eating. [name] has demonstrated progress in her  goal as evidenced by increased awareness of hunger patterns, and triggers for emotional eating. Venya also demonstrates willingness to engage in learned skills.   Plan: Rhian continues to appear able and willing to participate as evidenced by engagement in reciprocal conversation, and asking questions for clarification as appropriate. The next appointment will be scheduled in two weeks, which will be via [employer]. Once this provider's office resumes in-person appointments, Jaidon will be notified. The next session will focus further on mindfulness.

## 2018-10-23 NOTE — Progress Notes (Signed)
Office: [phone]  /  Fax: [phone] TeleHealth Visit:  [name] has verbally consented to this TeleHealth visit today. The patient is located at home, the provider is located at the [facility] office. The participants in this visit include the listed provider and patient. The visit was conducted today via Face Time.  HPI:   Chief Complaint: OBESITY [name] is here to discuss her progress with her obesity treatment plan. She is on the Category 2 plan and is following her eating plan approximately 95 % of the time. She states she is exercising 0 minutes 0 times per week. Talitha is doing well maintaining her weight while being in social isolation. She is trying to keep busy during the day, but is struggling with feeling bored and her food choices.  We were unable to weigh the patient today for this TeleHealth visit. She feels as if she has maintained weight since her last visit. She has lost 4 lbs since starting treatment with [name].  Vitamin D Deficiency [name] has a diagnosis of vitamin D deficiency. She is currently stable on vit D, but is not yet at goal. [name] denies nausea, vomiting, or muscle weakness.  ASSESSMENT AND PLAN:  Vitamin D deficiency - Plan: Vitamin D, Ergocalciferol, (DRISDOL) 1.25 MG (50000 UT) CAPS capsule  Class 2 severe obesity with serious comorbidity and body mass index (BMI) of 38.0 to 38.9 in adult, unspecified obesity type (HCC)  PLAN:  Vitamin D Deficiency [name] was informed that low vitamin D levels contribute to fatigue and are associated with obesity, breast, and colon cancer. [name] agrees to continue to take prescription Vit D ,000 IU every week #4 with no refills and will follow up for routine testing of vitamin D, at least 2-3 times per year. She was informed of the risk of over-replacement of vitamin D and agrees to not increase her dose unless she discusses this with [name] first. [name] agrees to follow up in 2 to 3 weeks as  directed.  Obesity [name] is currently in the action stage of change. As such, her goal is to continue with weight loss efforts. She has agreed to follow the Category 2 plan. [name] has been instructed to work up to a goal of 150 minutes of combined cardio and strengthening exercise per week for weight loss and overall health benefits. We discussed the following Behavioral Modification Strategies today: work on meal planning and easy cooking plans and ways to avoid boredom eating. [name] was given ideas of how to be creative with her Category 2 plan.  [name] has agreed to follow up with our clinic in 2 to 3 weeks. She was informed of the importance of frequent follow up visits to maximize her success with intensive lifestyle modifications for her multiple health conditions.  ALLERGIES: Allergies  Allergen Reactions   Promethazine Hcl Nausea Only    MEDICATIONS: Current Outpatient Medications on File Prior to Visit  Medication Sig Dispense Refill   b complex vitamins capsule Take 1 capsule by mouth daily.     buPROPion (WELLBUTRIN XL) 150 MG 24 hr tablet Take 150 mg by mouth daily.     Cholecalciferol (VITAMIN D) 2000 units CAPS Take 2,000 Units by mouth daily.     citalopram (CELEXA) 40 MG tablet Take 40 mg by mouth daily.  1   cyproheptadine (PERIACTIN) 4 MG tablet 1/2 to 1 tablet at bedtime 60 tablet 5   levothyroxine (SYNTHROID, LEVOTHROID) 75 MCG tablet Take 75 mcg by mouth daily.  0   metoprolol tartrate (LOPRESSOR) 25 MG tablet Take 12.5 mg by mouth at bedtime.      montelukast (SINGULAIR) 10 MG tablet TAKE ONE TABLET BY MOUTH EVERY NIGHT AT BEDTIME 90 tablet 0   Omega-3 Fatty Acids (FISH OIL) 1000 MG CAPS Take 1 capsule by mouth daily.     omeprazole (PRILOSEC) 40 MG capsule TAKE ONE CAPSULE BY MOUTH DAILY 90 capsule 2   Probiotic Product (PROBIOTIC DAILY PO) Take 1 capsule by mouth daily.     No current facility-administered medications on file prior to visit.      PAST MEDICAL HISTORY: Past Medical History:  Diagnosis Date   ADD (attention deficit disorder)    Allergies    Anxiety    Back pain    Brachioradial pruritus    CFS (chronic fatigue syndrome)    Depression    GERD (gastroesophageal reflux disease)    Hypothyroid    Joint pain    Lower extremity edema    Recurrent upper respiratory infection (URI)    SVT (supraventricular tachycardia) (HCC) 10/19/2015   Tachycardia    Vitamin D deficiency     PAST SURGICAL HISTORY: Past Surgical History:  Procedure Laterality Date   ADENOIDECTOMY     BLADDER REPAIR     CESAREAN SECTION     LAPAROSCOPY     myomectory      SOCIAL HISTORY: Social History   Tobacco Use   Smoking status: Never Smoker   Smokeless tobacco: Never Used  Substance Use Topics   Alcohol use: No   Drug use: No    FAMILY HISTORY: Family History  Problem Relation Age of Onset   Cancer Father    Diabetes Father    Heart disease Father    Hyperlipidemia Father    Hypertension Father    Depression Father    Obesity Father    Cancer Maternal Grandmother    Dementia Maternal Grandmother    Heart disease Paternal Grandmother    Hypertension Paternal Grandmother    Diabetes Paternal Grandfather    Heart disease Paternal Grandfather    Hyperlipidemia Paternal Grandfather    Hypertension Paternal Grandfather    COPD Mother    Asthma Mother    Allergic rhinitis Mother    Hypertension Mother    Depression Mother    Obesity Mother    Alcoholism Mother    Drug abuse Mother    Allergic rhinitis Sister    Liver disease Maternal Grandfather    Cancer Maternal Grandfather    Allergic rhinitis Sister    Angioedema Neg Hx    Eczema Neg Hx    Urticaria Neg Hx     ROS: Review of Systems  Gastrointestinal: Negative for nausea and vomiting.  Musculoskeletal:       Negative for muscle weakness.    PHYSICAL EXAM: Pt in no acute distress  RECENT  LABS AND TESTS: BMET    Component Value Date/Time   NA 142 09/04/2018 1318   K 4.6 09/04/2018 1318   CL 103 09/04/2018 1318   CO2 25 09/04/2018 1318   GLUCOSE 85 09/04/2018 1318   GLUCOSE 115 (H) 02/22/2017 1704   BUN 12 09/04/2018 1318   CREATININE 0.63 09/04/2018 1318   CALCIUM 9.0 09/04/2018 1318   GFRNONAA 103 09/04/2018 1318   GFRAA 119 09/04/2018 1318   Lab Results  Component Value Date   HGBA1C 5.2 09/04/2018   Lab Results  Component Value Date   INSULIN 14.7 09/04/2018  CBC    Component Value Date/Time   WBC 4.1 09/04/2018 1318   WBC 9.5 02/22/2017 1704   RBC 3.88 09/04/2018 1318   RBC 4.67 02/22/2017 1704   HGB 11.1 09/04/2018 1318   HCT 32.3 (L) 09/04/2018 1318   PLT 242 02/22/2017 1704   MCV 83 09/04/2018 1318   MCH 28.6 09/04/2018 1318   MCH 28.7 02/22/2017 1704   MCHC 34.4 09/04/2018 1318   MCHC 34.8 02/22/2017 1704   RDW 14.1 09/04/2018 1318   LYMPHSABS 0.9 09/04/2018 1318   MONOABS 0.6 02/07/2015 1606   EOSABS 0.3 09/04/2018 1318   BASOSABS 0.0 09/04/2018 1318   Iron/TIBC/Ferritin/ %Sat No results found for: IRON, TIBC, FERRITIN, IRONPCTSAT Lipid Panel     Component Value Date/Time   CHOL 159 09/04/2018 1318   TRIG 103 09/04/2018 1318   HDL 40 09/04/2018 1318   CHOLHDL 4.3 02/16/2008 0400   VLDL 13 02/16/2008 0400   LDLCALC 98 09/04/2018 1318   Hepatic Function Panel     Component Value Date/Time   PROT 6.7 09/04/2018 1318   ALBUMIN 4.1 09/04/2018 1318   AST 19 09/04/2018 1318   ALT 25 09/04/2018 1318   ALKPHOS 85 09/04/2018 1318   BILITOT 0.3 09/04/2018 1318   BILIDIR 0.1 01/27/2008 0943     Results for TASHEANA, MARCHEL (MRN [id number]) as of 10/23/2018 07:09  Ref. Range 09/04/2018 13:18  Vitamin D, 25-Hydroxy Latest Ref Range: 30.0 - 100.0 ng/mL 38.0    I, Kirke Corin, CMA, am acting as transcriptionist for Wilder Glade, MD I have reviewed the above documentation for accuracy and completeness, and I agree with the above.  -Quillian Quince, MD

## 2018-10-31 ENCOUNTER — Other Ambulatory Visit: Payer: Self-pay | Admitting: Cardiovascular Disease

## 2018-11-05 ENCOUNTER — Ambulatory Visit (INDEPENDENT_AMBULATORY_CARE_PROVIDER_SITE_OTHER): Payer: Self-pay | Admitting: Psychology

## 2018-11-05 ENCOUNTER — Telehealth: Payer: Self-pay | Admitting: Cardiovascular Disease

## 2018-11-05 NOTE — Telephone Encounter (Signed)
LMOM to schedule a Virtual Visit with Dr.  °

## 2018-11-11 ENCOUNTER — Ambulatory Visit (INDEPENDENT_AMBULATORY_CARE_PROVIDER_SITE_OTHER): Payer: Self-pay | Admitting: Family Medicine

## 2018-11-28 ENCOUNTER — Other Ambulatory Visit: Payer: Self-pay | Admitting: Allergy and Immunology

## 2018-11-28 NOTE — Telephone Encounter (Signed)
Courtesy refill no more refills will be given until her appt. In June

## 2018-12-25 ENCOUNTER — Other Ambulatory Visit (INDEPENDENT_AMBULATORY_CARE_PROVIDER_SITE_OTHER): Payer: Self-pay | Admitting: Family Medicine

## 2018-12-25 ENCOUNTER — Ambulatory Visit: Payer: BC Managed Care – PPO | Admitting: Psychology

## 2018-12-25 DIAGNOSIS — E559 Vitamin D deficiency, unspecified: Secondary | ICD-10-CM

## 2018-12-27 ENCOUNTER — Other Ambulatory Visit: Payer: Self-pay | Admitting: Allergy and Immunology

## 2018-12-31 ENCOUNTER — Encounter: Payer: Self-pay | Admitting: Allergy and Immunology

## 2018-12-31 ENCOUNTER — Ambulatory Visit: Payer: BC Managed Care – PPO | Admitting: Allergy and Immunology

## 2018-12-31 ENCOUNTER — Other Ambulatory Visit: Payer: Self-pay

## 2018-12-31 VITALS — BP 126/80 | HR 54 | Temp 97.9°F | Resp 16

## 2018-12-31 DIAGNOSIS — J3089 Other allergic rhinitis: Secondary | ICD-10-CM

## 2018-12-31 DIAGNOSIS — K219 Gastro-esophageal reflux disease without esophagitis: Secondary | ICD-10-CM

## 2018-12-31 DIAGNOSIS — R519 Headache, unspecified: Secondary | ICD-10-CM

## 2018-12-31 DIAGNOSIS — L989 Disorder of the skin and subcutaneous tissue, unspecified: Secondary | ICD-10-CM

## 2018-12-31 DIAGNOSIS — R51 Headache: Secondary | ICD-10-CM | POA: Diagnosis not present

## 2018-12-31 DIAGNOSIS — L308 Other specified dermatitis: Secondary | ICD-10-CM | POA: Diagnosis not present

## 2018-12-31 MED ORDER — CYPROHEPTADINE HCL 4 MG PO TABS: ORAL_TABLET | ORAL | 11 refills | Status: DC

## 2018-12-31 MED ORDER — OMEPRAZOLE 40 MG PO CPDR: 40.0000 mg | DELAYED_RELEASE_CAPSULE | Freq: Every day | ORAL | 3 refills | Status: DC

## 2018-12-31 MED ORDER — MONTELUKAST SODIUM 10 MG PO TABS: 10.0000 mg | ORAL_TABLET | Freq: Every day | ORAL | 11 refills | Status: DC

## 2018-12-31 NOTE — Patient Instructions (Signed)
  1.  Continue Allergen avoidance measures as best as possible  2.  Continue to Treat and prevent inflammation:   A.  Montelukast 10 mg tablet 1 time per day  B. OTC Nasacort 1 spray each nostril 0-7 times per week  C.  Mometasone 0.1% ointment to arms 0-7 times per week  3.  Continue to Treat and prevent headache:   A.  Minimize all forms of caffeine consumption  B.  Periactin 4 mg tablet -1/2 at bedtime  4.  Continue to Treat and prevent reflux:   A.  Omeprazole 40 mg tablet in a.m.  5.  Return to clinic in 12 months or earlier if problem  6.  Obtain full flu vaccine

## 2018-12-31 NOTE — Progress Notes (Signed)
[place] [facility] [place] [facility] [place]   Follow-up Note  Referring Provider: Aretta Nip, MD Primary Provider: Aretta Nip, MD Date of Office Visit: 12/31/2018  Subjective:   [name] (DOB: 1965-11-12) is a 53 y.o. female who returns to the [facility] on 12/31/2018 in re-evaluation of the following:  HPI: [name] returns to this clinic in reevaluation of allergic rhinitis, chronic headache disorder, reflux, and a history of inflammatory dermatosis.  Her last visit to this clinic was 02 July 2018.  She continues to do well with her airway and has not had any significant problems over the course of the past 6 months.  She has not required a systemic steroid or an antibiotic to treat any type of airway issue.  She continues on montelukast on a consistent basis and occasionally adds Nasacort.  She still remains headache free and her sleep is going quite well while using Periactin on a consistent basis.  Her reflux is under excellent control with attention to caffeine use and the use of a proton pump inhibitor.  She has not really been having any problems with her skin and does not need to use any topical mometasone.  Allergies as of 12/31/2018      Reactions   Promethazine Hcl Nausea Only      Medication List      b complex vitamins capsule Take 1 capsule by mouth daily.   buPROPion 150 MG 24 hr tablet Commonly known as: WELLBUTRIN XL Take 150 mg by mouth daily.   citalopram 40 MG tablet Commonly known as: CELEXA Take 40 mg by mouth daily.   cyproheptadine 4 MG tablet Commonly known as: PERIACTIN 1/2 to 1 tablet at bedtime   Fish Oil 1000 MG Caps Take 1 capsule by mouth daily.   levothyroxine 75 MCG tablet Commonly known as: SYNTHROID Take 75 mcg by mouth daily.   metoprolol tartrate 25 MG tablet Commonly known as: LOPRESSOR TAKE 1 TABLET BY MOUTH EVERY MORNING AND TAKE 1/2 TABLET BY MOUTH EVERY EVENING    montelukast 10 MG tablet Commonly known as: SINGULAIR TAKE ONE TABLET BY MOUTH EVERY NIGHT AT BEDTIME   omeprazole 40 MG capsule Commonly known as: PRILOSEC TAKE ONE CAPSULE BY MOUTH DAILY   PROBIOTIC DAILY PO Take 1 capsule by mouth daily.   Vitamin D (Ergocalciferol) 1.25 MG (50000 UT) Caps capsule Commonly known as: DRISDOL TAKE 1 CAPSULE BY MOUTH EVERY 7 DAYS   Vitamin D 50 MCG (2000 UT) Caps Take 2,000 Units by mouth daily.       Past Medical History:  Diagnosis Date  . ADD (attention deficit disorder)   . Allergies   . Anxiety   . Back pain   . Brachioradial pruritus   . CFS (chronic fatigue syndrome)   . Depression   . GERD (gastroesophageal reflux disease)   . Hypothyroid   . Joint pain   . Lower extremity edema   . Recurrent upper respiratory infection (URI)   . SVT (supraventricular tachycardia) ([place]) 10/19/2015  . Tachycardia   . Vitamin D deficiency     Past Surgical History:  Procedure Laterality Date  . ADENOIDECTOMY    . BLADDER REPAIR    . CESAREAN SECTION    . LAPAROSCOPY    . myomectory      Review of systems negative except as noted in HPI / PMHx or noted below:  Review of Systems  Constitutional: Negative.   HENT: Negative.  Eyes: Negative.   Respiratory: Negative.   Cardiovascular: Negative.   Gastrointestinal: Negative.   Genitourinary: Negative.   Musculoskeletal: Negative.   Skin: Negative.   Neurological: Negative.   Endo/Heme/Allergies: Negative.   Psychiatric/Behavioral: Negative.      Objective:   Vitals:   12/31/18 1050  BP: 126/80  Pulse: (!) 54  Resp: 16  Temp: 97.9 F (36.6 C)  SpO2: 98%          Physical Exam Constitutional:      Appearance: She is not diaphoretic.  HENT:     Head: Normocephalic.     Right Ear: Tympanic membrane, ear canal and external ear normal.     Left Ear: Tympanic membrane, ear canal and external ear normal.     Nose: Nose normal. No mucosal edema or rhinorrhea.      Mouth/Throat:     Pharynx: Uvula midline. No oropharyngeal exudate.  Eyes:     Conjunctiva/sclera: Conjunctivae normal.  Neck:     Thyroid: No thyromegaly.     Trachea: Trachea normal. No tracheal tenderness or tracheal deviation.  Cardiovascular:     Rate and Rhythm: Normal rate and regular rhythm.     Heart sounds: Normal heart sounds, S1 normal and S2 normal. No murmur.  Pulmonary:     Effort: No respiratory distress.     Breath sounds: Normal breath sounds. No stridor. No wheezing or rales.  Lymphadenopathy:     Head:     Right side of head: No tonsillar adenopathy.     Left side of head: No tonsillar adenopathy.     Cervical: No cervical adenopathy.  Skin:    Findings: No erythema or rash.     Nails: There is no clubbing.   Neurological:     Mental Status: She is alert.     Diagnostics: none  Assessment and Plan:   1. Perennial allergic rhinitis   2. Headache disorder   3. Gastroesophageal reflux disease, esophagitis presence not specified   4. Inflammatory dermatosis     1.  Continue Allergen avoidance measures as best as possible  2.  Continue to Treat and prevent inflammation:   A.  Montelukast 10 mg tablet 1 time per day  B. OTC Nasacort 1 spray each nostril 0-7 times per week  C.  Mometasone 0.1% ointment to arms 0-7 times per week  3.  Continue to Treat and prevent headache:   A.  Minimize all forms of caffeine consumption  B.  Periactin 4 mg tablet -1/2 at bedtime  4.  Continue to Treat and prevent reflux:   A.  Omeprazole 40 mg tablet in a.m.  5.  Return to clinic in 12 months or earlier if problem  6.  Obtain full flu vaccine  Kenney Housemananya appears to be doing very well on her current therapy which includes montelukast and Periactin and omeprazole directed against respiratory tract inflammation, headache and sleep dysfunction, and reflux.  She will continue on this plan and I will see her back in this clinic in 12 months or earlier if there is a  problem.  Laurette SchimkeEric Kozlow, MD Allergy / Immunology [facility]

## 2019-02-04 DIAGNOSIS — F411 Generalized anxiety disorder: Secondary | ICD-10-CM | POA: Diagnosis not present

## 2019-02-04 DIAGNOSIS — F3341 Major depressive disorder, recurrent, in partial remission: Secondary | ICD-10-CM | POA: Diagnosis not present

## 2019-04-16 DIAGNOSIS — M9904 Segmental and somatic dysfunction of sacral region: Secondary | ICD-10-CM | POA: Diagnosis not present

## 2019-04-16 DIAGNOSIS — M9903 Segmental and somatic dysfunction of lumbar region: Secondary | ICD-10-CM | POA: Diagnosis not present

## 2019-04-16 DIAGNOSIS — M5386 Other specified dorsopathies, lumbar region: Secondary | ICD-10-CM | POA: Diagnosis not present

## 2019-04-16 DIAGNOSIS — M5137 Other intervertebral disc degeneration, lumbosacral region: Secondary | ICD-10-CM | POA: Diagnosis not present

## 2019-04-30 DIAGNOSIS — M5137 Other intervertebral disc degeneration, lumbosacral region: Secondary | ICD-10-CM | POA: Diagnosis not present

## 2019-04-30 DIAGNOSIS — M5386 Other specified dorsopathies, lumbar region: Secondary | ICD-10-CM | POA: Diagnosis not present

## 2019-04-30 DIAGNOSIS — M9903 Segmental and somatic dysfunction of lumbar region: Secondary | ICD-10-CM | POA: Diagnosis not present

## 2019-04-30 DIAGNOSIS — M9904 Segmental and somatic dysfunction of sacral region: Secondary | ICD-10-CM | POA: Diagnosis not present

## 2019-05-01 DIAGNOSIS — F331 Major depressive disorder, recurrent, moderate: Secondary | ICD-10-CM | POA: Diagnosis not present

## 2019-05-01 DIAGNOSIS — F3341 Major depressive disorder, recurrent, in partial remission: Secondary | ICD-10-CM | POA: Diagnosis not present

## 2019-05-01 DIAGNOSIS — F5101 Primary insomnia: Secondary | ICD-10-CM | POA: Diagnosis not present

## 2019-05-21 DIAGNOSIS — Z1231 Encounter for screening mammogram for malignant neoplasm of breast: Secondary | ICD-10-CM | POA: Diagnosis not present

## 2019-05-21 DIAGNOSIS — Z6838 Body mass index (BMI) 38.0-38.9, adult: Secondary | ICD-10-CM | POA: Diagnosis not present

## 2019-05-21 DIAGNOSIS — Z01419 Encounter for gynecological examination (general) (routine) without abnormal findings: Secondary | ICD-10-CM | POA: Diagnosis not present

## 2019-05-21 DIAGNOSIS — I499 Cardiac arrhythmia, unspecified: Secondary | ICD-10-CM | POA: Insufficient documentation

## 2019-05-21 DIAGNOSIS — K219 Gastro-esophageal reflux disease without esophagitis: Secondary | ICD-10-CM | POA: Insufficient documentation

## 2019-07-02 DIAGNOSIS — R5383 Other fatigue: Secondary | ICD-10-CM | POA: Diagnosis not present

## 2019-07-02 DIAGNOSIS — Z20828 Contact with and (suspected) exposure to other viral communicable diseases: Secondary | ICD-10-CM | POA: Diagnosis not present

## 2019-07-02 DIAGNOSIS — R05 Cough: Secondary | ICD-10-CM | POA: Diagnosis not present

## 2019-07-02 DIAGNOSIS — R432 Parageusia: Secondary | ICD-10-CM | POA: Diagnosis not present

## 2019-07-03 DIAGNOSIS — Z20828 Contact with and (suspected) exposure to other viral communicable diseases: Secondary | ICD-10-CM | POA: Diagnosis not present

## 2019-07-22 DIAGNOSIS — Z0000 Encounter for general adult medical examination without abnormal findings: Secondary | ICD-10-CM | POA: Diagnosis not present

## 2019-07-31 DIAGNOSIS — Z13220 Encounter for screening for lipoid disorders: Secondary | ICD-10-CM | POA: Diagnosis not present

## 2019-07-31 DIAGNOSIS — Z23 Encounter for immunization: Secondary | ICD-10-CM | POA: Diagnosis not present

## 2019-07-31 DIAGNOSIS — E039 Hypothyroidism, unspecified: Secondary | ICD-10-CM | POA: Diagnosis not present

## 2019-07-31 DIAGNOSIS — K117 Disturbances of salivary secretion: Secondary | ICD-10-CM | POA: Diagnosis not present

## 2019-07-31 DIAGNOSIS — E559 Vitamin D deficiency, unspecified: Secondary | ICD-10-CM | POA: Diagnosis not present

## 2019-09-17 DIAGNOSIS — M9903 Segmental and somatic dysfunction of lumbar region: Secondary | ICD-10-CM | POA: Diagnosis not present

## 2019-09-17 DIAGNOSIS — M5137 Other intervertebral disc degeneration, lumbosacral region: Secondary | ICD-10-CM | POA: Diagnosis not present

## 2019-09-17 DIAGNOSIS — M9904 Segmental and somatic dysfunction of sacral region: Secondary | ICD-10-CM | POA: Diagnosis not present

## 2019-09-17 DIAGNOSIS — M5386 Other specified dorsopathies, lumbar region: Secondary | ICD-10-CM | POA: Diagnosis not present

## 2019-09-25 DIAGNOSIS — H9201 Otalgia, right ear: Secondary | ICD-10-CM | POA: Diagnosis not present

## 2019-12-03 DIAGNOSIS — M9903 Segmental and somatic dysfunction of lumbar region: Secondary | ICD-10-CM | POA: Diagnosis not present

## 2019-12-03 DIAGNOSIS — M9904 Segmental and somatic dysfunction of sacral region: Secondary | ICD-10-CM | POA: Diagnosis not present

## 2019-12-03 DIAGNOSIS — M5386 Other specified dorsopathies, lumbar region: Secondary | ICD-10-CM | POA: Diagnosis not present

## 2019-12-03 DIAGNOSIS — M5137 Other intervertebral disc degeneration, lumbosacral region: Secondary | ICD-10-CM | POA: Diagnosis not present

## 2019-12-30 ENCOUNTER — Ambulatory Visit: Payer: BC Managed Care – PPO | Admitting: Allergy and Immunology

## 2019-12-31 ENCOUNTER — Encounter: Payer: Self-pay | Admitting: Allergy

## 2019-12-31 ENCOUNTER — Ambulatory Visit: Payer: BC Managed Care – PPO | Admitting: Allergy

## 2019-12-31 ENCOUNTER — Other Ambulatory Visit: Payer: Self-pay

## 2019-12-31 VITALS — BP 128/84 | HR 62 | Temp 97.5°F | Resp 17

## 2019-12-31 DIAGNOSIS — J3089 Other allergic rhinitis: Secondary | ICD-10-CM

## 2019-12-31 DIAGNOSIS — J302 Other seasonal allergic rhinitis: Secondary | ICD-10-CM

## 2019-12-31 DIAGNOSIS — R12 Heartburn: Secondary | ICD-10-CM | POA: Insufficient documentation

## 2019-12-31 MED ORDER — OMEPRAZOLE 40 MG PO CPDR: 40.0000 mg | DELAYED_RELEASE_CAPSULE | Freq: Every day | ORAL | 3 refills | Status: DC

## 2019-12-31 MED ORDER — CYPROHEPTADINE HCL 4 MG PO TABS: ORAL_TABLET | ORAL | 3 refills | Status: DC

## 2019-12-31 MED ORDER — MONTELUKAST SODIUM 10 MG PO TABS: 10.0000 mg | ORAL_TABLET | Freq: Every day | ORAL | 3 refills | Status: DC

## 2019-12-31 NOTE — Assessment & Plan Note (Signed)
Past history - 2019 skin testing was positive to grass, mold, cat. Interim history - stable with below regimen. Sleeps with 1 cat.  Continue allergen avoidance measures as best as possible.   Continue with periactin 4mg  1/2 tablet at bedtime.  Continue with Singulair 10mg  1 tablet at bedtime.   May use Nasal saline spray (i.e., Simply Saline) or nasal saline lavage (i.e., NeilMed) as needed.  May use OTC Nasacort 1 spray per nostril 1-2 times a day for nasal symptoms as needed.

## 2019-12-31 NOTE — Patient Instructions (Addendum)
Continue allergen avoidance measures as best as possible for grass, mold and cat.   Continue with periactin 4mg  1/2 tablet at bedtime.  Continue with Singulair 10mg  1 tablet at bedtime.   May use Nasal saline spray (i.e., Simply Saline) or nasal saline lavage (i.e., NeilMed) as needed.  May use OTC Nasacort 1 spray per nostril 1-2 times a day for nasal symptoms as needed.   Continue omeprazole 40mg  tablet in the morning.  See below for reflux lifestyle.   Follow up in 1 year or sooner if needed.   Reducing Pollen Exposure  Pollen seasons: trees (spring), grass (summer) and ragweed/weeds (fall).  Keep windows closed in your home and car to lower pollen exposure.   Install air conditioning in the bedroom and throughout the house if possible.   Avoid going out in dry windy days - especially early morning.  Pollen counts are highest between 5 - 10 AM and on dry, hot and windy days.   Save outside activities for late afternoon or after a heavy rain, when pollen levels are lower.   Avoid mowing of grass if you have grass pollen allergy.  Be aware that pollen can also be transported indoors on people and pets.   Dry your clothes in an automatic dryer rather than hanging them outside where they might collect pollen.   Rinse hair and eyes before bedtime. Pet Allergen Avoidance:  Contrary to popular opinion, there are no hypoallergenic breeds of dogs or cats. That is because people are not allergic to an animals hair, but to an allergen found in the animal's saliva, dander (dead skin flakes) or urine. Pet allergy symptoms typically occur within minutes. For some people, symptoms can build up and become most severe 8 to 12 hours after contact with the animal. People with severe allergies can experience reactions in public places if dander has been transported on the pet owners clothing.  Keeping an animal outdoors is only a partial solution, since homes with pets in the yard  still have higher concentrations of animal allergens.  Before getting a pet, ask your allergist to determine if you are allergic to animals. If your pet is already considered part of your family, try to minimize contact and keep the pet out of the bedroom and other rooms where you spend a great deal of time.  As with dust mites, vacuum carpets often or replace carpet with a hardwood floor, tile or linoleum.  High-efficiency particulate air (HEPA) cleaners can reduce allergen levels over time.  While dander and saliva are the source of cat and dog allergens, urine is the source of allergens from rabbits, hamsters, mice and pigs; so ask a non-allergic family member to clean the animals cage.  If you have a pet allergy, talk to your allergist about the potential for allergy immunotherapy (allergy shots). This strategy can often provide long-term relief. Mold Control  Mold and fungi can grow on a variety of surfaces provided certain temperature and moisture conditions exist.   Outdoor molds grow on plants, decaying vegetation and soil. The major outdoor mold, Alternaria and Cladosporium, are found in very high numbers during hot and dry conditions. Generally, a late summer - fall peak is seen for common outdoor fungal spores. Rain will temporarily lower outdoor mold spore count, but counts rise rapidly when the rainy period ends.  The most important indoor molds are Aspergillus and Penicillium. Dark, humid and poorly ventilated basements are ideal sites for mold growth. The next most  common sites of mold growth are the bathroom and the kitchen. Outdoor (Seasonal) Mold Control  Use air conditioning and keep windows closed.  Avoid exposure to decaying vegetation.  Avoid leaf raking.  Avoid grain handling.  Consider wearing a face mask if working in moldy areas.  Indoor (Perennial) Mold Control   Maintain humidity below 50%.  Get rid of mold growth on hard surfaces with water,  detergent and, if necessary, 5% bleach (do not mix with other cleaners). Then dry the area completely. If mold covers an area more than 10 square feet, consider hiring an indoor environmental professional.  For clothing, washing with soap and water is best. If moldy items cannot be cleaned and dried, throw them away.  Remove sources e.g. contaminated carpets.  Repair and seal leaking roofs or pipes. Using dehumidifiers in damp basements may be helpful, but empty the water and clean units regularly to prevent mildew from forming. All rooms, especially basements, bathrooms and kitchens, require ventilation and cleaning to deter mold and mildew growth. Avoid carpeting on concrete or damp floors, and storing items in damp areas.   Heartburn Heartburn is a type of pain or discomfort that can happen in the throat or chest. It is often described as a burning pain. It may also cause a bad, acid-like taste in the mouth. Heartburn may feel worse when you lie down or bend over. It may be worse at night. It may be caused by stomach contents that move back up (reflux) into the tube that connects the mouth with the stomach (esophagus). Follow these instructions at home: Eating and drinking   Avoid certain foods and drinks as told by your doctor. This may include: ? Coffee and tea (with or without caffeine). ? Drinks that have alcohol. ? Energy drinks and sports drinks. ? Carbonated drinks or sodas. ? Chocolate and cocoa. ? Peppermint and mint flavorings. ? Garlic and onions. ? Horseradish. ? Spicy and acidic foods, such as:  Peppers.  Chili powder and curry powder.  Vinegar.  Hot sauces and BBQ sauce. ? Citrus fruit juices and citrus fruits, such as:  Oranges.  Lemons.  Limes. ? Tomato-based foods, such as:  Red sauce and pizza with red sauce.  Chili.  Salsa. ? Fried and fatty foods, such as:  Donuts.  [place] fries and potato chips.  High-fat dressings. ? High-fat meats, such  as:  Hot dogs and sausage.  Rib eye steak.  Ham and bacon. ? High-fat dairy items, such as:  Whole milk.  Butter.  Cream cheese.  Eat small meals often. Avoid eating large meals.  Avoid drinking large amounts of liquid with your meals.  Avoid eating meals during the 2-3 hours before bedtime.  Avoid lying down right after you eat.  Do not exercise right after you eat. Lifestyle      If you are overweight, lose an amount of weight that is healthy for you. Ask your doctor about a safe weight loss goal.  Do not use any products that contain nicotine or tobacco, including cigarettes, e-cigarettes, and chewing tobacco. These can make your symptoms worse. If you need help quitting, ask your doctor.  Wear loose clothes. Do not wear anything tight around your waist.  Raise (elevate) the head of your bed about 6 inches (15 cm) when you sleep.  Try to lower your stress. If you need help doing this, ask your doctor. General instructions  Pay attention to any changes in your symptoms.  Take over-the-counter and prescription  medicines only as told by your doctor. ? Do not take aspirin, ibuprofen, or other NSAIDs unless your doctor says it is okay. ? Stop medicines only as told by your doctor.  Keep all follow-up visits as told by your doctor. This is important. Contact a doctor if:  You have new symptoms.  You lose weight and you do not know why it is happening.  You have trouble swallowing, or it hurts to swallow.  You have wheezing or a cough that keeps happening.  Your symptoms do not get better with treatment.  You have heartburn often for more than 2 weeks. Get help right away if:  You have pain in your arms, neck, jaw, teeth, or back.  You feel sweaty, dizzy, or light-headed.  You have chest pain or shortness of breath.  You throw up (vomit) and your throw up looks like blood or coffee grounds.  Your poop (stool) is bloody or black. These symptoms may  represent a serious problem that is an emergency. Do not wait to see if the symptoms will go away. Get medical help right away. Call your local emergency services (911 in the [place] Do not drive yourself to the hospital. Summary  Heartburn is a type of pain that can happen in the throat or chest. It can feel like a burning pain. It may also cause a bad, acid-like taste in the mouth.  You may need to avoid certain foods and drinks to help your symptoms. Ask your doctor what foods and drinks you should avoid.  Take over-the-counter and prescription medicines only as told by your doctor. Do not take aspirin, ibuprofen, or other NSAIDs unless your doctor told you to do so.  Contact your doctor if your symptoms do not get better or they get worse. This information is not intended to replace advice given to you by your health care provider. Make sure you discuss any questions you have with your health care provider. Document Revised: 12/03/2017 Document Reviewed: 12/03/2017 Elsevier Patient Education  2020 [employer].

## 2019-12-31 NOTE — Progress Notes (Signed)
Follow Up Note  RE: [name] MRN: [id number] DOB: [birth year] Date of Office Visit: 12/31/2019  Referring provider: Clayborn Heron, MD Primary care provider: Clayborn Heron, MD  Chief Complaint: Allergic Rhinitis   History of Present Illness: I had the pleasure of seeing [name] for a follow up visit at the [facility] on 12/31/2019. She is a 54 y.o. female, who is being followed for allergic rhinitis, reflux. Her previous allergy office visit was on 12/31/2018 with Dr. Lucie Leather. Today is a regular follow up visit. Up to date with COVID-19 vaccine: no  Allergic rhinitis:  2019 skin testing was positive to grass, mold, cat. Doing well with below regimen. She sleeps with one of her cats. Takes Periactin 1/2 tablet and Singulair 10mg  at night. Nasacort causes nosebleeds so only using it on a rare occasion.  Reflux Doing well with omeprazole 40mg  daily. Noted worsening symptoms when she missed a few doses.   Assessment and Plan: Oney is a 54 y.o. female with: Seasonal and perennial allergic rhinitis Past history - 2019 skin testing was positive to grass, mold, cat. Interim history - stable with below regimen. Sleeps with 1 cat.  Continue allergen avoidance measures as best as possible.   Continue with periactin 4mg  1/2 tablet at bedtime.  Continue with Singulair 10mg  1 tablet at bedtime.   May use Nasal saline spray (i.e., Simply Saline) or nasal saline lavage (i.e., NeilMed) as needed.  May use OTC Nasacort 1 spray per nostril 1-2 times a day for nasal symptoms as needed.  Heartburn Stable with omeprazole 40mg  daily.  Continue omeprazole 40mg  tablet in the morning.  See below for reflux lifestyle.   Return in about 1 year (around 12/30/2020).  Meds ordered this encounter  Medications  . cyproheptadine (PERIACTIN) 4 MG tablet    Sig: 1/2 to 1 tablet at bedtime    Dispense:  90 tablet    Refill:  3  . montelukast (SINGULAIR) 10 MG  tablet    Sig: Take 1 tablet (10 mg total) by mouth at bedtime.    Dispense:  90 tablet    Refill:  3  . omeprazole (PRILOSEC) 40 MG capsule    Sig: Take 1 capsule (40 mg total) by mouth daily.    Dispense:  90 capsule    Refill:  3   Diagnostics: None.  Medication List:  Current Outpatient Medications  Medication Sig Dispense Refill  . buPROPion (WELLBUTRIN XL) 150 MG 24 hr tablet Take 150 mg by mouth daily.    . Cholecalciferol (VITAMIN D) 2000 units CAPS Take 2,000 Units by mouth daily.    . citalopram (CELEXA) 40 MG tablet Take 40 mg by mouth daily.  1  . cyproheptadine (PERIACTIN) 4 MG tablet 1/2 to 1 tablet at bedtime 90 tablet 3  . levothyroxine (SYNTHROID, LEVOTHROID) 75 MCG tablet Take 75 mcg by mouth daily.  0  . metoprolol tartrate (LOPRESSOR) 25 MG tablet TAKE 1 TABLET BY MOUTH EVERY MORNING AND TAKE 1/2 TABLET BY MOUTH EVERY EVENING 135 tablet 2  . montelukast (SINGULAIR) 10 MG tablet Take 1 tablet (10 mg total) by mouth at bedtime. 90 tablet 3  . Omega-3 Fatty Acids (FISH OIL) 1000 MG CAPS Take 1 capsule by mouth daily.    2020 omeprazole (PRILOSEC) 40 MG capsule Take 1 capsule (40 mg total) by mouth daily. 90 capsule 3  . Probiotic Product (PROBIOTIC DAILY PO) Take 1 capsule by mouth daily.     Vitamin D, Ergocalciferol, (DRISDOL) 1.25 MG (50000 UT) CAPS capsule TAKE 1 CAPSULE BY MOUTH EVERY 7 DAYS 4 capsule 0  . b complex vitamins capsule Take 1 capsule by mouth daily. (Patient not taking: Reported on 12/31/2019)     No current facility-administered medications for this visit.   Allergies: Allergies  Allergen Reactions  . Promethazine Hcl Nausea Only   I reviewed her past medical history, social history, family history, and environmental history and no significant changes have been reported from her previous visit.  Review of Systems  Constitutional: Negative for appetite change, chills, fever and unexpected weight change.  HENT: Negative for congestion and  rhinorrhea.   Eyes: Negative for itching.  Respiratory: Negative for cough, chest tightness, shortness of breath and wheezing.   Gastrointestinal: Negative for abdominal pain.  Skin: Negative for rash.  Allergic/Immunologic: Positive for environmental allergies.  Neurological: Negative for headaches.   Objective: BP 128/84 (BP Location: Right Arm, Patient Position: Sitting, Cuff Size: Normal)   Pulse 62   Temp (!) 97.5 F (36.4 C) (Temporal)   Resp 17   SpO2 97%  There is no height or weight on file to calculate BMI. Physical Exam Vitals and nursing note reviewed.  Constitutional:      Appearance: Normal appearance. She is well-developed.  HENT:     Head: Normocephalic and atraumatic.     Right Ear: Tympanic membrane and external ear normal.     Left Ear: Tympanic membrane and external ear normal.     Nose: Nose normal.     Mouth/Throat:     Mouth: Mucous membranes are moist.     Pharynx: Oropharynx is clear.  Eyes:     Conjunctiva/sclera: Conjunctivae normal.  Cardiovascular:     Rate and Rhythm: Normal rate and regular rhythm.     Heart sounds: Normal heart sounds. No murmur heard.   Pulmonary:     Effort: Pulmonary effort is normal.     Breath sounds: Normal breath sounds. No wheezing, rhonchi or rales.  Musculoskeletal:     Cervical back: Neck supple.  Skin:    General: Skin is warm.     Findings: No rash.  Neurological:     Mental Status: She is alert and oriented to person, place, and time.  Psychiatric:        Mood and Affect: Mood normal.        Behavior: Behavior normal.    Previous notes and tests were reviewed. The plan was reviewed with the patient/family, and all questions/concerned were addressed.  It was my pleasure to see [name] today and participate in her care. Please feel free to contact me with any questions or concerns.  Sincerely,  Rexene Alberts, DO Allergy & Immunology  [facility] office:  [phone] [facility] office: [place] office: [phone]

## 2019-12-31 NOTE — Assessment & Plan Note (Signed)
Stable with omeprazole 40mg  daily.  Continue omeprazole 40mg  tablet in the morning.  See below for reflux lifestyle.

## 2020-01-03 DIAGNOSIS — M5137 Other intervertebral disc degeneration, lumbosacral region: Secondary | ICD-10-CM | POA: Diagnosis not present

## 2020-01-03 DIAGNOSIS — M9904 Segmental and somatic dysfunction of sacral region: Secondary | ICD-10-CM | POA: Diagnosis not present

## 2020-01-03 DIAGNOSIS — M9903 Segmental and somatic dysfunction of lumbar region: Secondary | ICD-10-CM | POA: Diagnosis not present

## 2020-01-03 DIAGNOSIS — M5386 Other specified dorsopathies, lumbar region: Secondary | ICD-10-CM | POA: Diagnosis not present

## 2020-02-25 ENCOUNTER — Other Ambulatory Visit: Payer: Self-pay | Admitting: Allergy and Immunology

## 2020-03-30 DIAGNOSIS — R509 Fever, unspecified: Secondary | ICD-10-CM | POA: Diagnosis not present

## 2020-03-30 DIAGNOSIS — U071 COVID-19: Secondary | ICD-10-CM | POA: Diagnosis not present

## 2020-04-05 ENCOUNTER — Telehealth (HOSPITAL_COMMUNITY): Payer: Self-pay | Admitting: Nurse Practitioner

## 2020-04-05 ENCOUNTER — Other Ambulatory Visit: Payer: Self-pay

## 2020-04-05 ENCOUNTER — Inpatient Hospital Stay (HOSPITAL_COMMUNITY)
Admission: EM | Admit: 2020-04-05 | Discharge: 2020-04-13 | DRG: 177 | Disposition: A | Discharge status: Home / Self Care | Payer: BC Managed Care – PPO | Attending: Internal Medicine | Admitting: Internal Medicine

## 2020-04-05 ENCOUNTER — Emergency Department (HOSPITAL_COMMUNITY): Payer: BC Managed Care – PPO

## 2020-04-05 ENCOUNTER — Encounter (HOSPITAL_COMMUNITY): Payer: Self-pay | Admitting: Obstetrics and Gynecology

## 2020-04-05 DIAGNOSIS — F329 Major depressive disorder, single episode, unspecified: Secondary | ICD-10-CM | POA: Diagnosis present

## 2020-04-05 DIAGNOSIS — J302 Other seasonal allergic rhinitis: Secondary | ICD-10-CM | POA: Diagnosis not present

## 2020-04-05 DIAGNOSIS — R0602 Shortness of breath: Secondary | ICD-10-CM | POA: Diagnosis not present

## 2020-04-05 DIAGNOSIS — Z818 Family history of other mental and behavioral disorders: Secondary | ICD-10-CM

## 2020-04-05 DIAGNOSIS — R0600 Dyspnea, unspecified: Secondary | ICD-10-CM

## 2020-04-05 DIAGNOSIS — F988 Other specified behavioral and emotional disorders with onset usually occurring in childhood and adolescence: Secondary | ICD-10-CM | POA: Diagnosis not present

## 2020-04-05 DIAGNOSIS — Z813 Family history of other psychoactive substance abuse and dependence: Secondary | ICD-10-CM

## 2020-04-05 DIAGNOSIS — D72810 Lymphocytopenia: Secondary | ICD-10-CM

## 2020-04-05 DIAGNOSIS — F32A Depression, unspecified: Secondary | ICD-10-CM | POA: Diagnosis present

## 2020-04-05 DIAGNOSIS — Z811 Family history of alcohol abuse and dependence: Secondary | ICD-10-CM | POA: Diagnosis not present

## 2020-04-05 DIAGNOSIS — Z8249 Family history of ischemic heart disease and other diseases of the circulatory system: Secondary | ICD-10-CM

## 2020-04-05 DIAGNOSIS — I471 Supraventricular tachycardia, unspecified: Secondary | ICD-10-CM

## 2020-04-05 DIAGNOSIS — J1282 Pneumonia due to coronavirus disease 2019: Secondary | ICD-10-CM | POA: Diagnosis not present

## 2020-04-05 DIAGNOSIS — R52 Pain, unspecified: Secondary | ICD-10-CM | POA: Diagnosis not present

## 2020-04-05 DIAGNOSIS — U071 COVID-19: Principal | ICD-10-CM | POA: Diagnosis present

## 2020-04-05 DIAGNOSIS — Z79899 Other long term (current) drug therapy: Secondary | ICD-10-CM

## 2020-04-05 DIAGNOSIS — E876 Hypokalemia: Secondary | ICD-10-CM

## 2020-04-05 DIAGNOSIS — Z888 Allergy status to other drugs, medicaments and biological substances status: Secondary | ICD-10-CM | POA: Diagnosis not present

## 2020-04-05 DIAGNOSIS — I10 Essential (primary) hypertension: Secondary | ICD-10-CM | POA: Diagnosis not present

## 2020-04-05 DIAGNOSIS — E669 Obesity, unspecified: Secondary | ICD-10-CM | POA: Diagnosis not present

## 2020-04-05 DIAGNOSIS — K219 Gastro-esophageal reflux disease without esophagitis: Secondary | ICD-10-CM | POA: Diagnosis not present

## 2020-04-05 DIAGNOSIS — R509 Fever, unspecified: Secondary | ICD-10-CM | POA: Diagnosis present

## 2020-04-05 DIAGNOSIS — R1084 Generalized abdominal pain: Secondary | ICD-10-CM | POA: Diagnosis not present

## 2020-04-05 DIAGNOSIS — Z809 Family history of malignant neoplasm, unspecified: Secondary | ICD-10-CM

## 2020-04-05 DIAGNOSIS — Z833 Family history of diabetes mellitus: Secondary | ICD-10-CM

## 2020-04-05 DIAGNOSIS — R0902 Hypoxemia: Secondary | ICD-10-CM

## 2020-04-05 DIAGNOSIS — Z83438 Family history of other disorder of lipoprotein metabolism and other lipidemia: Secondary | ICD-10-CM

## 2020-04-05 DIAGNOSIS — E039 Hypothyroidism, unspecified: Secondary | ICD-10-CM | POA: Diagnosis not present

## 2020-04-05 DIAGNOSIS — Z825 Family history of asthma and other chronic lower respiratory diseases: Secondary | ICD-10-CM | POA: Diagnosis not present

## 2020-04-05 DIAGNOSIS — J9601 Acute respiratory failure with hypoxia: Secondary | ICD-10-CM | POA: Diagnosis not present

## 2020-04-05 DIAGNOSIS — F419 Anxiety disorder, unspecified: Secondary | ICD-10-CM | POA: Diagnosis present

## 2020-04-05 DIAGNOSIS — A4189 Other specified sepsis: Secondary | ICD-10-CM

## 2020-04-05 DIAGNOSIS — Z79890 Hormone replacement therapy (postmenopausal): Secondary | ICD-10-CM | POA: Diagnosis not present

## 2020-04-05 DIAGNOSIS — E038 Other specified hypothyroidism: Secondary | ICD-10-CM | POA: Diagnosis not present

## 2020-04-05 LAB — COMPREHENSIVE METABOLIC PANEL
ALT: 23 U/L (ref 0–44)
AST: 41 U/L (ref 15–41)
Albumin: 3.7 g/dL (ref 3.5–5.0)
Alkaline Phosphatase: 59 U/L (ref 38–126)
Anion gap: 14 (ref 5–15)
BUN: 15 mg/dL (ref 6–20)
CO2: 24 mmol/L (ref 22–32)
Calcium: 8.2 mg/dL — ABNORMAL LOW (ref 8.9–10.3)
Chloride: 97 mmol/L — ABNORMAL LOW (ref 98–111)
Creatinine, Ser: 0.66 mg/dL (ref 0.44–1.00)
GFR calc Af Amer: >60 mL/min (ref >60)
GFR calc non Af Amer: >60 mL/min (ref >60)
Glucose, Bld: 112 mg/dL — ABNORMAL HIGH (ref 70–99)
Potassium: 3.1 mmol/L — ABNORMAL LOW (ref 3.5–5.1)
Sodium: 135 mmol/L (ref 135–145)
Total Bilirubin: 0.5 mg/dL (ref 0.3–1.2)
Total Protein: 7.5 g/dL (ref 6.5–8.1)

## 2020-04-05 LAB — CBC WITH DIFFERENTIAL/PLATELET
Abs Immature Granulocytes: 0.01 10*3/uL (ref 0.00–0.07)
Basophils Absolute: 0 10*3/uL (ref 0.0–0.1)
Basophils Relative: 0 %
Eosinophils Absolute: 0 10*3/uL (ref 0.0–0.5)
Eosinophils Relative: 0 %
HCT: 38.3 % (ref 36.0–46.0)
Hemoglobin: 13.2 g/dL (ref 12.0–15.0)
Immature Granulocytes: 0 %
Lymphocytes Relative: 14 %
Lymphs Abs: 0.4 10*3/uL — ABNORMAL LOW (ref 0.7–4.0)
MCH: 28.4 pg (ref 26.0–34.0)
MCHC: 34.5 g/dL (ref 30.0–36.0)
MCV: 82.4 fL (ref 80.0–100.0)
Monocytes Absolute: 0.2 10*3/uL (ref 0.1–1.0)
Monocytes Relative: 7 %
Neutro Abs: 2.1 10*3/uL (ref 1.7–7.7)
Neutrophils Relative %: 79 %
Platelets: 193 10*3/uL (ref 150–400)
RBC: 4.65 MIL/uL (ref 3.87–5.11)
RDW: 14.1 % (ref 11.5–15.5)
WBC: 2.6 10*3/uL — ABNORMAL LOW (ref 4.0–10.5)
nRBC: 0 % (ref 0.0–0.2)

## 2020-04-05 LAB — D-DIMER, QUANTITATIVE: D-Dimer, Quant: 0.88 ug{FEU}/mL — ABNORMAL HIGH (ref 0.00–0.50)

## 2020-04-05 LAB — FERRITIN: Ferritin: 273 ng/mL (ref 11–307)

## 2020-04-05 LAB — C-REACTIVE PROTEIN: CRP: 6.7 mg/dL — ABNORMAL HIGH (ref <1.0)

## 2020-04-05 LAB — LACTIC ACID, PLASMA: Lactic Acid, Venous: 1.6 mmol/L (ref 0.5–1.9)

## 2020-04-05 LAB — SARS CORONAVIRUS 2 BY RT PCR (HOSPITAL ORDER, PERFORMED IN ~~LOC~~ HOSPITAL LAB): SARS Coronavirus 2: POSITIVE — AB

## 2020-04-05 LAB — FIBRINOGEN: Fibrinogen: 564 mg/dL — ABNORMAL HIGH (ref 210–475)

## 2020-04-05 LAB — LACTATE DEHYDROGENASE: LDH: 296 U/L — ABNORMAL HIGH (ref 98–192)

## 2020-04-05 LAB — MAGNESIUM: Magnesium: 2.1 mg/dL (ref 1.7–2.4)

## 2020-04-05 LAB — ABO/RH: ABO/RH(D): A POS

## 2020-04-05 LAB — TRIGLYCERIDES: Triglycerides: 116 mg/dL

## 2020-04-05 LAB — HCG, QUANTITATIVE, PREGNANCY: hCG, Beta Chain, Quant, S: 2 m[IU]/mL

## 2020-04-05 LAB — HIV ANTIBODY (ROUTINE TESTING W REFLEX): HIV Screen 4th Generation wRfx: NONREACTIVE

## 2020-04-05 LAB — PROCALCITONIN: Procalcitonin: <0.1 ng/mL

## 2020-04-05 MED ORDER — ONDANSETRON HCL 4 MG/2ML IJ SOLN
4.0000 mg | Freq: Once | INTRAMUSCULAR | Status: AC
  Administered 2020-04-05: 4 mg via INTRAVENOUS
  Filled 2020-04-05: qty 2

## 2020-04-05 MED ORDER — PREDNISONE 50 MG PO TABS: 50.0000 mg | ORAL_TABLET | Freq: Every day | ORAL | Status: DC

## 2020-04-05 MED ORDER — ALUM & MAG HYDROXIDE-SIMETH 200-200-20 MG/5ML PO SUSP
30.0000 mL | ORAL | Status: DC | PRN
  Administered 2020-04-05: 30 mL via ORAL
  Filled 2020-04-05: qty 30

## 2020-04-05 MED ORDER — IPRATROPIUM-ALBUTEROL 20-100 MCG/ACT IN AERS
1.0000 | INHALATION_SPRAY | Freq: Four times a day (QID) | RESPIRATORY_TRACT | Status: DC
  Administered 2020-04-05 – 2020-04-08 (×12): 1 via RESPIRATORY_TRACT
  Filled 2020-04-05: qty 4

## 2020-04-05 MED ORDER — ZOLPIDEM TARTRATE 5 MG PO TABS: 5.0000 mg | ORAL_TABLET | Freq: Every evening | ORAL | Status: DC | PRN

## 2020-04-05 MED ORDER — LEVOTHYROXINE SODIUM 75 MCG PO TABS
75.0000 ug | ORAL_TABLET | Freq: Every day | ORAL | Status: DC
  Administered 2020-04-06 – 2020-04-13 (×8): 75 ug via ORAL
  Filled 2020-04-05 (×8): qty 1

## 2020-04-05 MED ORDER — SODIUM CHLORIDE 0.9 % IV SOLN
100.0000 mg | Freq: Every day | INTRAVENOUS | Status: AC
  Administered 2020-04-06 – 2020-04-09 (×4): 100 mg via INTRAVENOUS
  Filled 2020-04-05 (×4): qty 20

## 2020-04-05 MED ORDER — METHYLPREDNISOLONE SODIUM SUCC 125 MG IJ SOLR
0.5000 mg/kg | Freq: Two times a day (BID) | INTRAMUSCULAR | Status: DC
  Administered 2020-04-05 – 2020-04-07 (×4): 40.625 mg via INTRAVENOUS
  Filled 2020-04-05 (×4): qty 2

## 2020-04-05 MED ORDER — SODIUM CHLORIDE 0.9 % IV SOLN
200.0000 mg | Freq: Once | INTRAVENOUS | Status: AC
  Administered 2020-04-05: 200 mg via INTRAVENOUS
  Filled 2020-04-05: qty 200

## 2020-04-05 MED ORDER — POLYETHYLENE GLYCOL 3350 17 G PO PACK: 17.0000 g | PACK | Freq: Every day | ORAL | Status: DC | PRN

## 2020-04-05 MED ORDER — ASCORBIC ACID 500 MG PO TABS
500.0000 mg | ORAL_TABLET | Freq: Every day | ORAL | Status: DC
  Administered 2020-04-05 – 2020-04-13 (×9): 500 mg via ORAL
  Filled 2020-04-05 (×9): qty 1

## 2020-04-05 MED ORDER — METOPROLOL TARTRATE 12.5 MG HALF TABLET
12.5000 mg | ORAL_TABLET | Freq: Every day | ORAL | Status: DC
  Administered 2020-04-05 – 2020-04-12 (×8): 12.5 mg via ORAL
  Filled 2020-04-05 (×8): qty 1

## 2020-04-05 MED ORDER — CYPROHEPTADINE HCL 4 MG PO TABS
2.0000 mg | ORAL_TABLET | Freq: Every day | ORAL | Status: DC
  Administered 2020-04-05 – 2020-04-12 (×8): 2 mg via ORAL
  Filled 2020-04-05 (×9): qty 1

## 2020-04-05 MED ORDER — MAGNESIUM CITRATE PO SOLN: 1.0000 | Freq: Once | ORAL | Status: DC | PRN

## 2020-04-05 MED ORDER — ACETAMINOPHEN 500 MG PO TABS
1000.0000 mg | ORAL_TABLET | Freq: Once | ORAL | Status: AC
  Administered 2020-04-05: 1000 mg via ORAL
  Filled 2020-04-05: qty 2

## 2020-04-05 MED ORDER — BUPROPION HCL ER (XL) 150 MG PO TB24: 150.0000 mg | ORAL_TABLET | Freq: Every day | ORAL | Status: DC

## 2020-04-05 MED ORDER — ONDANSETRON HCL 4 MG/2ML IJ SOLN
4.0000 mg | Freq: Four times a day (QID) | INTRAMUSCULAR | Status: DC | PRN
  Administered 2020-04-05: 4 mg via INTRAVENOUS
  Filled 2020-04-05: qty 2

## 2020-04-05 MED ORDER — CITALOPRAM HYDROBROMIDE 20 MG PO TABS: 40.0000 mg | ORAL_TABLET | Freq: Every day | ORAL | Status: DC

## 2020-04-05 MED ORDER — PANTOPRAZOLE SODIUM 40 MG PO TBEC
40.0000 mg | DELAYED_RELEASE_TABLET | Freq: Every day | ORAL | Status: DC
  Administered 2020-04-05 – 2020-04-13 (×9): 40 mg via ORAL
  Filled 2020-04-05 (×9): qty 1

## 2020-04-05 MED ORDER — HYDROCOD POLST-CPM POLST ER 10-8 MG/5ML PO SUER
5.0000 mL | Freq: Two times a day (BID) | ORAL | Status: DC | PRN
  Administered 2020-04-05 – 2020-04-07 (×4): 5 mL via ORAL
  Filled 2020-04-05 (×4): qty 5

## 2020-04-05 MED ORDER — VITAMIN D 25 MCG (1000 UNIT) PO TABS
2000.0000 [IU] | ORAL_TABLET | Freq: Every day | ORAL | Status: DC
  Administered 2020-04-06 – 2020-04-12 (×7): 2000 [IU] via ORAL
  Filled 2020-04-05 (×7): qty 2

## 2020-04-05 MED ORDER — ONDANSETRON HCL 4 MG PO TABS: 4.0000 mg | ORAL_TABLET | Freq: Four times a day (QID) | ORAL | Status: DC | PRN

## 2020-04-05 MED ORDER — POTASSIUM CHLORIDE CRYS ER 20 MEQ PO TBCR
40.0000 meq | EXTENDED_RELEASE_TABLET | ORAL | Status: AC
  Administered 2020-04-05 (×2): 40 meq via ORAL
  Filled 2020-04-05 (×2): qty 2

## 2020-04-05 MED ORDER — MONTELUKAST SODIUM 10 MG PO TABS
10.0000 mg | ORAL_TABLET | Freq: Every day | ORAL | Status: DC
  Administered 2020-04-05 – 2020-04-12 (×8): 10 mg via ORAL
  Filled 2020-04-05 (×9): qty 1

## 2020-04-05 MED ORDER — ZINC SULFATE 220 (50 ZN) MG PO CAPS
220.0000 mg | ORAL_CAPSULE | Freq: Every day | ORAL | Status: DC
  Administered 2020-04-05 – 2020-04-13 (×9): 220 mg via ORAL
  Filled 2020-04-05 (×9): qty 1

## 2020-04-05 MED ORDER — ACETAMINOPHEN 325 MG PO TABS
650.0000 mg | ORAL_TABLET | Freq: Four times a day (QID) | ORAL | Status: DC | PRN
  Administered 2020-04-06 – 2020-04-08 (×3): 650 mg via ORAL
  Filled 2020-04-05 (×3): qty 2

## 2020-04-05 MED ORDER — ENOXAPARIN SODIUM 40 MG/0.4ML ~~LOC~~ SOLN
40.0000 mg | SUBCUTANEOUS | Status: DC
  Administered 2020-04-05 – 2020-04-06 (×2): 40 mg via SUBCUTANEOUS
  Filled 2020-04-05 (×2): qty 0.4

## 2020-04-05 MED ORDER — OXYCODONE HCL 5 MG PO TABS: 5.0000 mg | ORAL_TABLET | ORAL | Status: DC | PRN

## 2020-04-05 MED ORDER — GUAIFENESIN-DM 100-10 MG/5ML PO SYRP
10.0000 mL | ORAL_SOLUTION | ORAL | Status: DC | PRN
  Administered 2020-04-06 – 2020-04-08 (×3): 10 mL via ORAL
  Filled 2020-04-05 (×5): qty 10

## 2020-04-05 MED ORDER — BISACODYL 5 MG PO TBEC: 5.0000 mg | DELAYED_RELEASE_TABLET | Freq: Every day | ORAL | Status: DC | PRN

## 2020-04-05 NOTE — Progress Notes (Addendum)
9/202/2021 700 pm TOC CM spoke to pt and states her entire household is sick from COVID except son. Her sister will be caring for son while she, husband and mother in law are in the hospital. Explained we will assist with DME, and/or transportation as needed. TOC CM/CSW will continue to follow for dc needs. Isidoro Donning RN CCM, [facility] TOC CM [phone]  TOC CM reviewed referral, pt will be assessed at time of dc for transportation to her home. Will be able to arrange transport with [facility] if pt unable to secure a ride home. Pt may be eligible for COVID remote program. Attempted call to pt in her room and she did not answer. Isidoro Donning RN CCM, [facility] TOC CM [phone]

## 2020-04-05 NOTE — Telephone Encounter (Signed)
I connected by phone with [name] on 04/05/2020 at 4:05 PM to discuss the potential use of a new treatment for mild to moderate COVID-19 viral infection in non-hospitalized patients.  This patient is a 54 y.o. female that meets the FDA criteria for Emergency Use Authorization of COVID monoclonal antibody casirivimab/imdevimab.  Has a (+) direct SARS-CoV-2 viral test result  Has mild or moderate COVID-19   Is NOT hospitalized due to COVID-19  Is within 10 days of symptom onset  Has at least one of the high risk factor(s) for progression to severe COVID-19 and/or hospitalization as defined in EUA.  Specific high risk criteria : BMI > 25   I have spoken and communicated the following to the patient or parent/caregiver regarding COVID monoclonal antibody treatment:  1. FDA has authorized the emergency use for the treatment of mild to moderate COVID-19 in adults and pediatric patients with positive results of direct SARS-CoV-2 viral testing who are 71 years of age and older weighing at least 40 kg, and who are at high risk for progressing to severe COVID-19 and/or hospitalization.  2. The significant known and potential risks and benefits of COVID monoclonal antibody, and the extent to which such potential risks and benefits are unknown.  3. Information on available alternative treatments and the risks and benefits of those alternatives, including clinical trials.  4. Patients treated with COVID monoclonal antibody should continue to self-isolate and use infection control measures (e.g., wear mask, isolate, social distance, avoid sharing personal items, clean and disinfect "high touch" surfaces, and frequent handwashing) according to CDC guidelines.   5. The patient or parent/caregiver has the option to accept or refuse COVID monoclonal antibody treatment.  After reviewing this information with the patient, The patient agreed to proceed with receiving casirivimab\imdevimab infusion and  will be provided a copy of the Fact sheet prior to receiving the infusion. [name] 04/05/2020 4:05 PM   Shortly after phone call was notified by ED physician that patient will be admitted to hospital. Will cancel MAB appointment.

## 2020-04-05 NOTE — ED Triage Notes (Signed)
Per EMS- Patient c/o N/V, dizziness, and weakness x 1 week.

## 2020-04-05 NOTE — ED Provider Notes (Signed)
[facility]-EMERGENCY DEPT Provider Note   CSN: [id number] Arrival date & time: 04/05/20  1404     History Chief Complaint  Patient presents with  . Emesis  . Fever  . Weakness    [name] is a 54 y.o. female.  54 year old female with prior medical history as detailed below presents for evaluation of shortness of breath and fever.  Patient with suspected Covid.  Patient reports approximately 9 days of symptoms.  Patient reports increasing shortness of breath over the last 48 hours.  Patient is presenting today with multiple family members with similar symptoms.    The history is provided by the patient and medical records.  Illness Location:  Fever, dyspnea, cough Severity:  Moderate Onset quality:  Gradual Duration:  9 days Timing:  Constant Progression:  Worsening Chronicity:  New Associated symptoms: cough, fever and shortness of breath        Past Medical History:  Diagnosis Date  . ADD (attention deficit disorder)   . Allergies   . Anxiety   . Back pain   . Brachioradial pruritus   . CFS (chronic fatigue syndrome)   . Depression   . GERD (gastroesophageal reflux disease)   . Hypothyroid   . Joint pain   . Lower extremity edema   . Recurrent upper respiratory infection (URI)   . SVT (supraventricular tachycardia) (HCC) 10/19/2015  . Tachycardia   . Vitamin D deficiency     Patient Active Problem List   Diagnosis Date Noted  . Seasonal and perennial allergic rhinitis 12/31/2019  . Heartburn 12/31/2019  . Palpitations 07/11/2016  . Family history of coronary artery disease in father 07/11/2016  . Obesity (BMI 30-39.9) 07/11/2016  . Hypothyroidism 07/11/2016  . SINUSITIS- ACUTE-NOS 04/22/2008  . URI 04/22/2008  . DEPRESSION 01/27/2008  . ALLERGIC RHINITIS 01/27/2008  . HEADACHE 01/27/2008  . UTI'S, HX OF 01/27/2008    Past Surgical History:  Procedure Laterality Date  . ADENOIDECTOMY    . BLADDER REPAIR    . CESAREAN  SECTION    . LAPAROSCOPY    . myomectory       OB History    Gravida  1   Para      Term      Preterm      AB      Living  1     SAB      TAB      Ectopic      Multiple      Live Births              Family History  Problem Relation Age of Onset  . Cancer Father   . Diabetes Father   . Heart disease Father   . Hyperlipidemia Father   . Hypertension Father   . Depression Father   . Obesity Father   . Cancer Maternal Grandmother   . Dementia Maternal Grandmother   . Heart disease Paternal Grandmother   . Hypertension Paternal Grandmother   . Diabetes Paternal Grandfather   . Heart disease Paternal Grandfather   . Hyperlipidemia Paternal Grandfather   . Hypertension Paternal Grandfather   . COPD Mother   . Asthma Mother   . Allergic rhinitis Mother   . Hypertension Mother   . Depression Mother   . Obesity Mother   . Alcoholism Mother   . Drug abuse Mother   . Allergic rhinitis Sister   . Liver disease Maternal Grandfather   . Cancer  Maternal Grandfather   . Allergic rhinitis Sister   . Angioedema Neg Hx   . Eczema Neg Hx   . Urticaria Neg Hx     Social History   Tobacco Use  . Smoking status: Never Smoker  . Smokeless tobacco: Never Used  Vaping Use  . Vaping Use: Never used  Substance Use Topics  . Alcohol use: No  . Drug use: No    Home Medications Prior to Admission medications   Medication Sig Start Date End Date Taking? Authorizing Provider  b complex vitamins capsule Take 1 capsule by mouth daily. Patient not taking: Reported on 12/31/2019    [provider]  buPROPion (WELLBUTRIN XL) 150 MG 24 hr tablet Take 150 mg by mouth daily.    [provider]  Cholecalciferol (VITAMIN D) 2000 units CAPS Take 2,000 Units by mouth daily.    [provider]  citalopram (CELEXA) 40 MG tablet Take 40 mg by mouth daily. 09/29/15   [provider]  cyproheptadine (PERIACTIN) 4 MG tablet 1/2 to 1 tablet at  bedtime 12/31/19   Ellamae Sia, DO  levothyroxine (SYNTHROID, LEVOTHROID) 75 MCG tablet Take 75 mcg by mouth daily. 01/19/15   [provider]  metoprolol tartrate (LOPRESSOR) 25 MG tablet TAKE 1 TABLET BY MOUTH EVERY MORNING AND TAKE 1/2 TABLET BY MOUTH EVERY EVENING 10/31/18   Chilton Si, MD  montelukast (SINGULAIR) 10 MG tablet Take 1 tablet (10 mg total) by mouth at bedtime. 12/31/19   Ellamae Sia, DO  Omega-3 Fatty Acids (FISH OIL) 1000 MG CAPS Take 1 capsule by mouth daily.    [provider]  omeprazole (PRILOSEC) 40 MG capsule TAKE ONE CAPSULE BY MOUTH DAILY 02/25/20   Kozlow, Alvira Philips, MD  Probiotic Product (PROBIOTIC DAILY PO) Take 1 capsule by mouth daily.    [provider]  Vitamin D, Ergocalciferol, (DRISDOL) 1.25 MG (50000 UT) CAPS capsule TAKE 1 CAPSULE BY MOUTH EVERY 7 DAYS 10/22/18   Quillian Quince D, MD    Allergies    Promethazine hcl  Review of Systems   Review of Systems  Constitutional: Positive for fever.  Respiratory: Positive for cough and shortness of breath.   All other systems reviewed and are negative.   Physical Exam Updated Vital Signs BP (!) 165/79   Pulse 84   Temp (!) 103.2 F (39.6 C) (Oral) Comment: RN aware  Resp (!) 27   Ht 5\' 1"  (1.549 m)   Wt 81.6 kg   SpO2 95%   BMI 34.01 kg/m   Physical Exam Vitals and nursing note reviewed.  Constitutional:      General: She is not in acute distress.    Appearance: Normal appearance. She is well-developed. She is ill-appearing.  HENT:     Head: Normocephalic and atraumatic.     Nose: Nose normal.  Eyes:     Conjunctiva/sclera: Conjunctivae normal.     Pupils: Pupils are equal, round, and reactive to light.  Cardiovascular:     Rate and Rhythm: Regular rhythm. Tachycardia present.     Heart sounds: Normal heart sounds.  Pulmonary:     Effort: Pulmonary effort is normal. No respiratory distress.     Breath sounds: Normal breath sounds.  Abdominal:     General: There  is no distension.     Palpations: Abdomen is soft.     Tenderness: There is no abdominal tenderness.  Musculoskeletal:        General: No deformity. Normal  range of motion.     Cervical back: Normal range of motion and neck supple.  Skin:    General: Skin is warm and dry.  Neurological:     General: No focal deficit present.     Mental Status: She is alert and oriented to person, place, and time.     ED Results / Procedures / Treatments   Labs (all labs ordered are listed, but only abnormal results are displayed) Labs Reviewed  SARS CORONAVIRUS 2 BY RT PCR (HOSPITAL ORDER, PERFORMED IN [facility]) - Abnormal; Notable for the following components:      Result Value   SARS Coronavirus 2 POSITIVE (*)    All other components within normal limits  CBC WITH DIFFERENTIAL/PLATELET - Abnormal; Notable for the following components:   WBC 2.6 (*)    Lymphs Abs 0.4 (*)    All other components within normal limits  COMPREHENSIVE METABOLIC PANEL - Abnormal; Notable for the following components:   Potassium 3.1 (*)    Chloride 97 (*)    Glucose, Bld 112 (*)    Calcium 8.2 (*)    All other components within normal limits  D-DIMER, QUANTITATIVE (NOT [facility]) - Abnormal; Notable for the following components:   D-Dimer, Quant 0.88 (*)    All other components within normal limits  LACTATE DEHYDROGENASE - Abnormal; Notable for the following components:   LDH 296 (*)    All other components within normal limits  FIBRINOGEN - Abnormal; Notable for the following components:   Fibrinogen 564 (*)    All other components within normal limits  C-REACTIVE PROTEIN - Abnormal; Notable for the following components:   CRP 6.7 (*)    All other components within normal limits  CULTURE, BLOOD (ROUTINE X 2)  CULTURE, BLOOD (ROUTINE X 2)  LACTIC ACID, PLASMA  PROCALCITONIN  FERRITIN  TRIGLYCERIDES  LACTIC ACID, PLASMA  HCG, QUANTITATIVE, PREGNANCY    EKG EKG  Interpretation  Date/Time:  Monday April 05 2020 14:30:04 EDT Ventricular Rate:  87 PR Interval:    QRS Duration: 70 QT Interval:  365 QTC Calculation: 440 R Axis:   7 Text Interpretation: Sinus rhythm Nonspecific T abnormalities, anterior leads Confirmed by Kristine RoyalMessick, Magaline Steinberg [phone]([id number]) on 04/05/2020 2:53:42 PM   Radiology DG Chest Port 1 View  Result Date: 04/05/2020 CLINICAL DATA:  Dyspnea EXAM: PORTABLE CHEST 1 VIEW COMPARISON:  05/11/2017 chest radiograph and prior. FINDINGS: Hypoinflated lungs. Patchy bibasilar opacities. No pneumothorax or pleural effusion. Cardiomediastinal silhouette within normal limits. No acute osseous abnormality. IMPRESSION: Patchy bibasilar opacities, atelectasis versus infection. Electronically Signed   By: Stana Buntinghikanele  Emekauwa M.D.   On: 04/05/2020 15:07    Procedures Procedures (including critical care time)  Medications Ordered in ED Medications  acetaminophen (TYLENOL) tablet 1,000 mg (1,000 mg Oral Given 04/05/20 1438)  ondansetron (ZOFRAN) injection 4 mg (4 mg Intravenous Given 04/05/20 1439)    ED Course  I have reviewed the triage vital signs and the nursing notes.  Pertinent labs & imaging results that were available during my care of the patient were reviewed by me and considered in my medical decision making (see chart for details).    MDM Rules/Calculators/A&P                          MDM  Screen complete  [name] was evaluated in Emergency Department on 04/05/2020 for the symptoms described in the history of present illness. She was evaluated  in the context of the global COVID-19 pandemic, which necessitated consideration that the patient might be at risk for infection with the SARS-CoV-2 virus that causes COVID-19. Institutional protocols and algorithms that pertain to the evaluation of patients at risk for COVID-19 are in a state of rapid change based on information released by regulatory bodies including the CDC and federal and  state organizations. These policies and algorithms were followed during the patient's care in the ED.  Patient is presenting for evaluation of fever, cough, and suspected Covid.  Patient noted to be hypoxic on room air into the mid 80s. On 2 L [place] her pulse ox is in the mid 90's.  Patient would benefit from admission for further treatment.   Hospitalist service is aware of case and will evaluate for admission.    Final Clinical Impression(s) / ED Diagnoses Final diagnoses:  Hypoxia  COVID-19    Rx / DC Orders ED Discharge Orders    None       Wynetta Fines, MD 04/05/20 [phone]

## 2020-04-05 NOTE — H&P (Signed)
History and Physical    [name] EGB:[id number] DOB: 05/02/1966 DOA: 04/05/2020  PCP: Clayborn Heron, MD Patient coming from: Home  Chief Complaint: Nausea, vomiting, dizziness, weakness, shortness of breath and cough  HPI: [name] is a 54 y.o. female with history of anxiety, depression, ADD, hypothyroidism, obesity and SVT presenting with the above chief complaints.  Patient had progressive nausea, vomiting, weakness, shortness of breath, cough and chest pain with cough and emesis for about 9 days.  She reports countless numbers of emesis.  Emesis was nonbloody.  She denies diarrhea.  She reports poor p.o. intake and generalized body ache. She also reports fever to 102.3. The whole family with COVID-19 symptoms for about the same duration and presented to ED, and getting admitted. She is unvaccinated against COVID-19 out of concern about side effects.   Patient denies history of diabetes, lung disease, heart disease or kidney disease.  She denies smoking cigarettes, drinking alcohol recreational drug use.  In ED, febrile to 103.2.  RR as high as 29.  Reportedly desaturated to mid 80s requiring 2 L to recover to mid 90s.  Pertinent labs include WBC 2.6, lymphopenia, elevated inflammatory markers with CRP of 6.7, D-dimer of 0.88, LDH of 296 and fibrinogen 564, K3.1.  CXR with bilateral infiltrates.  Lactic acid and procalcitonin negative.  Received Tylenol and Zofran and hospitalist service called for admission for acute hypoxemic respiratory failure due to COVID-19 pneumonia  ROS All review of system negative except for pertinent positives and negatives as history of present illness above.  PMH Past Medical History:  Diagnosis Date  . ADD (attention deficit disorder)   . Allergies   . Anxiety   . Back pain   . Brachioradial pruritus   . CFS (chronic fatigue syndrome)   . Depression   . GERD (gastroesophageal reflux disease)   . Hypothyroid   . Joint pain   . Lower  extremity edema   . Recurrent upper respiratory infection (URI)   . SVT (supraventricular tachycardia) (HCC) 10/19/2015  . Tachycardia   . Vitamin D deficiency    PSH Past Surgical History:  Procedure Laterality Date  . ADENOIDECTOMY    . BLADDER REPAIR    . CESAREAN SECTION    . LAPAROSCOPY    . myomectory     Fam HX Family History  Problem Relation Age of Onset  . Cancer Father   . Diabetes Father   . Heart disease Father   . Hyperlipidemia Father   . Hypertension Father   . Depression Father   . Obesity Father   . Cancer Maternal Grandmother   . Dementia Maternal Grandmother   . Heart disease Paternal Grandmother   . Hypertension Paternal Grandmother   . Diabetes Paternal Grandfather   . Heart disease Paternal Grandfather   . Hyperlipidemia Paternal Grandfather   . Hypertension Paternal Grandfather   . COPD Mother   . Asthma Mother   . Allergic rhinitis Mother   . Hypertension Mother   . Depression Mother   . Obesity Mother   . Alcoholism Mother   . Drug abuse Mother   . Allergic rhinitis Sister   . Liver disease Maternal Grandfather   . Cancer Maternal Grandfather   . Allergic rhinitis Sister   . Angioedema Neg Hx   . Eczema Neg Hx   . Urticaria Neg Hx    Social Hx  reports that she has never smoked. She has never used smokeless tobacco. She reports that she does  not drink alcohol and does not use drugs.  Allergy Allergies  Allergen Reactions  . Promethazine Hcl Nausea Only   Home Meds Prior to Admission medications   Medication Sig Start Date End Date Taking? Authorizing Provider  b complex vitamins capsule Take 1 capsule by mouth daily. Patient not taking: Reported on 12/31/2019    [provider]  buPROPion (WELLBUTRIN XL) 150 MG 24 hr tablet Take 150 mg by mouth daily.    [provider]  Cholecalciferol (VITAMIN D) 2000 units CAPS Take 2,000 Units by mouth daily.    [provider]  citalopram (CELEXA) 40 MG tablet Take  40 mg by mouth daily. 09/29/15   [provider]  cyproheptadine (PERIACTIN) 4 MG tablet 1/2 to 1 tablet at bedtime 12/31/19   Ellamae Sia, DO  levothyroxine (SYNTHROID, LEVOTHROID) 75 MCG tablet Take 75 mcg by mouth daily. 01/19/15   [provider]  metoprolol tartrate (LOPRESSOR) 25 MG tablet TAKE 1 TABLET BY MOUTH EVERY MORNING AND TAKE 1/2 TABLET BY MOUTH EVERY EVENING 10/31/18   Chilton Si, MD  montelukast (SINGULAIR) 10 MG tablet Take 1 tablet (10 mg total) by mouth at bedtime. 12/31/19   Ellamae Sia, DO  Omega-3 Fatty Acids (FISH OIL) 1000 MG CAPS Take 1 capsule by mouth daily.    [provider]  omeprazole (PRILOSEC) 40 MG capsule TAKE ONE CAPSULE BY MOUTH DAILY 02/25/20   Kozlow, Alvira Philips, MD  Probiotic Product (PROBIOTIC DAILY PO) Take 1 capsule by mouth daily.    [provider]  Vitamin D, Ergocalciferol, (DRISDOL) 1.25 MG (50000 UT) CAPS capsule TAKE 1 CAPSULE BY MOUTH EVERY 7 DAYS 10/22/18   Quillian Quince D, MD    Physical Exam: Vitals:   04/05/20 1415 04/05/20 1500 04/05/20 1515 04/05/20 1530  BP:  (!) 166/77  (!) 165/79  Pulse:  85 81 84  Resp:  (!) 25 (!) 29 (!) 27  Temp:      TempSrc:      SpO2: 96% 94% 95% 95%  Weight:      Height:        GENERAL: No acute distress.  Appears well.  HEENT: MMM.  Vision and hearing grossly intact.  NECK: Supple.  No apparent JVD.  RESP:  92% on 2 L. No IWOB.  Continuously coughing during exam.  Rhonchi bilaterally. CVS: RRR. Heart sounds normal.  ABD/GI/GU: Bowel sounds present. Soft. Non tender.  MSK/EXT:  Moves extremities. No apparent deformity or edema.  SKIN: no apparent skin lesion or wound NEURO: Awake, alert and oriented appropriately.  No gross deficit.  PSYCH: Calm. Normal affect.   Personally Reviewed Radiological Exams DG Chest Port 1 View  Result Date: 04/05/2020 CLINICAL DATA:  Dyspnea EXAM: PORTABLE CHEST 1 VIEW COMPARISON:  05/11/2017 chest radiograph and prior. FINDINGS:  Hypoinflated lungs. Patchy bibasilar opacities. No pneumothorax or pleural effusion. Cardiomediastinal silhouette within normal limits. No acute osseous abnormality. IMPRESSION: Patchy bibasilar opacities, atelectasis versus infection. Electronically Signed   By: Stana Bunting M.D.   On: 04/05/2020 15:07     Personally Reviewed Labs: CBC: Recent Labs  Lab 04/05/20 1421  WBC 2.6*  NEUTROABS 2.1  HGB 13.2  HCT 38.3  MCV 82.4  PLT 193   Basic Metabolic Panel: Recent Labs  Lab 04/05/20 1421  NA 135  K 3.1*  CL 97*  CO2 24  GLUCOSE 112*  BUN 15  CREATININE 0.66  CALCIUM 8.2*   GFR: Estimated Creatinine Clearance: 77.8 mL/min (  by C-G formula based on SCr of 0.66 mg/dL). Liver Function Tests: Recent Labs  Lab 04/05/20 1421  AST 41  ALT 23  ALKPHOS 59  BILITOT 0.5  PROT 7.5  ALBUMIN 3.7   No results for input(s): LIPASE, AMYLASE in the last 168 hours. No results for input(s): AMMONIA in the last 168 hours. Coagulation Profile: No results for input(s): INR, PROTIME in the last 168 hours. Cardiac Enzymes: No results for input(s): CKTOTAL, CKMB, CKMBINDEX, TROPONINI in the last 168 hours. BNP (last 3 results) No results for input(s): PROBNP in the last 8760 hours. HbA1C: No results for input(s): HGBA1C in the last 72 hours. CBG: No results for input(s): GLUCAP in the last 168 hours. Lipid Profile: Recent Labs    04/05/20 1421  TRIG 116   Thyroid Function Tests: No results for input(s): TSH, T4TOTAL, FREET4, T3FREE, THYROIDAB in the last 72 hours. Anemia Panel: Recent Labs    04/05/20 1421  FERRITIN 273   Urine analysis:    Component Value Date/Time   COLORURINE YELLOW 12/17/2016 2252   APPEARANCEUR HAZY (A) 12/17/2016 2252   LABSPEC 1.024 12/17/2016 2252   PHURINE 5.0 12/17/2016 2252   GLUCOSEU NEGATIVE 12/17/2016 2252   HGBUR NEGATIVE 12/17/2016 2252   HGBUR trace-intact 01/27/2008 0912   BILIRUBINUR NEGATIVE 12/17/2016 2252   BILIRUBINUR  negative 08/15/2015 1212   KETONESUR 20 (A) 12/17/2016 2252   PROTEINUR NEGATIVE 12/17/2016 2252   UROBILINOGEN 0.2 08/15/2015 1212   UROBILINOGEN 0.2 02/07/2015 1710   NITRITE NEGATIVE 12/17/2016 2252   LEUKOCYTESUR NEGATIVE 12/17/2016 2252    Sepsis Labs:  Lactic acid and procalcitonin within normal.  Personally Reviewed EKG:  Twelve-lead EKG sinus rhythm with nonspecific T wave changes lateral leads  Assessment/Plan Acute hypoxemic respiratory failure and sepsis due to COVID-19 pneumonia: Unvaccinated.  Symptomatic for 9 days.  Reportedly desaturated to mid 80s requiring 2 L to recover.  Elevated inflammatory markers.  CXR with bilateral opacities consistent with COVID-19.  Lactic acid and procalcitonin negative.  Meet sepsis criteria with fever, tachypnea, leukopenia unknown infection source.  Recent Labs    04/05/20 1421  DDIMER 0.88*  FERRITIN 273  LDH 296*  CRP 6.7*  -Start IV Solu-Medrol and remdesivir -Verbally consented to baricitinib if indicated.  No contraindication. -Monitor inflammatory markers. -Incentive spirometry, mucolytic's, antitussive, vit C/zinc and proning as tolerated -OOB -Counseled and encouraged to take COVID-19 vaccine once she recovers from this illness.  History of SVT: Stable. -Continue home metoprolol  Hypokalemia: K3.1 -K-Dur 40 mEq x 2 -Check magnesium  Anxiety/depression/ADD: Stable -Continue home meds after med rec  Elevated D-dimer: Likely due to COVID-19 -Continue monitoring  Leukopenia/lymphopenia: Likely due to COVID-19. -Continue monitoring  Class I obesity: BMI 34.01. -Encourage lifestyle change to lose weight.  DVT prophylaxis: Subcu Lovenox  Code Status: Full code Family Communication: Updated patient's husband over the phone.  He is also being admitted. Disposition Plan: Admit to Med-Surg Consults called: None Admission status: Inpatient   Almon Hercules MD [facility]  If 7PM-7AM, please contact  night-coverage [email or web address]  04/05/2020, 4:57 PM

## 2020-04-06 LAB — COMPREHENSIVE METABOLIC PANEL
ALT: 21 U/L (ref 0–44)
AST: 34 U/L (ref 15–41)
Albumin: 3.2 g/dL — ABNORMAL LOW (ref 3.5–5.0)
Alkaline Phosphatase: 51 U/L (ref 38–126)
Anion gap: 12 (ref 5–15)
BUN: 17 mg/dL (ref 6–20)
CO2: 23 mmol/L (ref 22–32)
Calcium: 8.6 mg/dL — ABNORMAL LOW (ref 8.9–10.3)
Chloride: 103 mmol/L (ref 98–111)
Creatinine, Ser: 0.65 mg/dL (ref 0.44–1.00)
GFR calc Af Amer: >60 mL/min (ref >60)
GFR calc non Af Amer: >60 mL/min (ref >60)
Glucose, Bld: 132 mg/dL — ABNORMAL HIGH (ref 70–99)
Potassium: 4.3 mmol/L (ref 3.5–5.1)
Sodium: 138 mmol/L (ref 135–145)
Total Bilirubin: 0.5 mg/dL (ref 0.3–1.2)
Total Protein: 6.8 g/dL (ref 6.5–8.1)

## 2020-04-06 LAB — CBC WITH DIFFERENTIAL/PLATELET
Abs Immature Granulocytes: 0.02 10*3/uL (ref 0.00–0.07)
Basophils Absolute: 0 10*3/uL (ref 0.0–0.1)
Basophils Relative: 0 %
Eosinophils Absolute: 0 10*3/uL (ref 0.0–0.5)
Eosinophils Relative: 0 %
HCT: 36.6 % (ref 36.0–46.0)
Hemoglobin: 12.1 g/dL (ref 12.0–15.0)
Immature Granulocytes: 1 %
Lymphocytes Relative: 18 %
Lymphs Abs: 0.3 10*3/uL — ABNORMAL LOW (ref 0.7–4.0)
MCH: 28.1 pg (ref 26.0–34.0)
MCHC: 33.1 g/dL (ref 30.0–36.0)
MCV: 84.9 fL (ref 80.0–100.0)
Monocytes Absolute: 0.2 10*3/uL (ref 0.1–1.0)
Monocytes Relative: 9 %
Neutro Abs: 1.3 10*3/uL — ABNORMAL LOW (ref 1.7–7.7)
Neutrophils Relative %: 72 %
Platelets: 162 10*3/uL (ref 150–400)
RBC: 4.31 MIL/uL (ref 3.87–5.11)
RDW: 14.2 % (ref 11.5–15.5)
WBC: 1.8 10*3/uL — ABNORMAL LOW (ref 4.0–10.5)
nRBC: 0 % (ref 0.0–0.2)

## 2020-04-06 LAB — D-DIMER, QUANTITATIVE: D-Dimer, Quant: 0.65 ug/mL-FEU — ABNORMAL HIGH (ref 0.00–0.50)

## 2020-04-06 LAB — C-REACTIVE PROTEIN: CRP: 6.7 mg/dL — ABNORMAL HIGH (ref <1.0)

## 2020-04-06 LAB — LACTIC ACID, PLASMA: Lactic Acid, Venous: 0.7 mmol/L (ref 0.5–1.9)

## 2020-04-06 MED ORDER — MUSCLE RUB 10-15 % EX CREA
1.0000 "application " | TOPICAL_CREAM | CUTANEOUS | Status: DC | PRN
  Filled 2020-04-06: qty 85

## 2020-04-06 MED ORDER — CITALOPRAM HYDROBROMIDE 20 MG PO TABS
40.0000 mg | ORAL_TABLET | Freq: Every day | ORAL | Status: DC
  Administered 2020-04-06 – 2020-04-13 (×8): 40 mg via ORAL
  Filled 2020-04-06 (×9): qty 2

## 2020-04-06 MED ORDER — BUPROPION HCL ER (XL) 150 MG PO TB24
150.0000 mg | ORAL_TABLET | Freq: Every day | ORAL | Status: DC
  Administered 2020-04-06 – 2020-04-13 (×8): 150 mg via ORAL
  Filled 2020-04-06 (×8): qty 1

## 2020-04-06 MED ORDER — ENSURE ENLIVE PO LIQD
237.0000 mL | Freq: Two times a day (BID) | ORAL | Status: DC
  Administered 2020-04-06 – 2020-04-12 (×10): 237 mL via ORAL

## 2020-04-06 MED ORDER — PROSOURCE PLUS PO LIQD
30.0000 mL | Freq: Every day | ORAL | Status: DC
  Administered 2020-04-07 – 2020-04-12 (×6): 30 mL via ORAL
  Filled 2020-04-06 (×5): qty 30

## 2020-04-06 NOTE — Progress Notes (Signed)
PROGRESS NOTE    [name]  WUJ:[id number] DOB: 01-03-1966 DOA: 04/05/2020 PCP: Clayborn Heron, MD    Brief Narrative:  54 year old lady with history of anxiety, depression, ADD, hypothyroidism and obesity and history of SVT presented to the ER with nausea, vomiting, dizziness, weakness and shortness of breath for about 9 days. Unvaccinated against COVID-19. In the emergency room, temperature 103.2, respiratory rate 29.  80% on mobility on room air needed 2 L to correct.  Lactic acid and procalcitonin normal.  Chest x-ray with bilateral infiltrates.  COVID-19 positive. Patient, her husband and her mother-in-law everybody is admitted with COVID-19 on 9/20.  Assessment & Plan:   Active Problems:   Acute hypoxemic respiratory failure due to COVID-19 [facility])  Acute hypoxemic respiratory failure due to COVID-19 virus infection: Continue to monitor due to significant symptoms  chest physiotherapy, incentive spirometry, deep breathing exercises, sputum induction, mucolytic's and bronchodilators. Supplemental oxygen to keep saturations more than 90%. Covid directed therapy with , steroids, Solu-Medrol IV remdesivir, day 2/5 Due to severity of symptoms, patient will need daily inflammatory markers, chest x-rays, liver function test to monitor and direct COVID-19 therapies.  COVID-19 Labs  Recent Labs    04/05/20 1421 04/06/20 0434  DDIMER 0.88* 0.65*  FERRITIN 273  --   LDH 296*  --   CRP 6.7* 6.7*    Lab Results  Component Value Date   SARSCOV2NAA POSITIVE (A) 04/05/2020   SpO2: (!) 89 % O2 Flow Rate (L/min): 2 L/min   History of SVT: Stable on metoprolol.  Hypokalemia: Treated and improved.  Magnesium normal.  Anxiety/depression/ADD: Stable.  On SSRI from home.    DVT prophylaxis: enoxaparin (LOVENOX) injection 40 mg Start: 04/05/20 1800   Code Status: Full code Family Communication: Patient herself.  Disposition Plan: Status is: Inpatient  Remains inpatient  appropriate because:Inpatient level of care appropriate due to severity of illness   Dispo: The patient is from: Home              Anticipated d/c is to: Home              Anticipated d/c date is: 3 days              Patient currently is not medically stable to d/c.         Consultants:   None  Procedures:   None  Antimicrobials:  Antibiotics Given (last 72 hours)    Date/Time Action Medication Dose Rate   04/05/20 1755 New Bag/Given   remdesivir 200 mg in sodium chloride 0.9% 250 mL IVPB 200 mg 580 mL/hr   04/06/20 1021 New Bag/Given   remdesivir 100 mg in sodium chloride 0.9 % 100 mL IVPB 100 mg 200 mL/hr         Subjective: Patient seen and examined.  Still has cough mostly dry, unable to take deep breaths.  Has some headache.  No more nausea.  Remains on 2 L oxygen.  Denies any chest pain, leg pain or dizziness.  Denies any abdominal pain. Detailed discussion about different modality of treatment.  Patient appropriately asked about different therapeutics including ivermectin and monoclonal antibody infusion and answered to her satisfaction. Eager to mobilize and get better to go home.  Objective: Vitals:   04/05/20 2132 04/06/20 0204 04/06/20 0637 04/06/20 1002  BP: 126/63 134/74 120/71 128/77  Pulse: 75 66 64 66  Resp: (!) 21 20 20 18   Temp: 99.2 F (37.3 C) 98.4 F (36.9 C) 98 F (36.7  C)   TempSrc: Oral Oral Oral   SpO2: 93% 92% 91% (!) 89%  Weight:      Height:        Intake/Output Summary (Last 24 hours) at 04/06/2020 1200 Last data filed at 04/05/2020 1825 Gross per 24 hour  Intake 289.41 ml  Output --  Net 289.41 ml   Filed Weights   04/05/20 1413  Weight: 81.6 kg    Examination:  General exam: Appears calm and comfortable at rest, on 2 L.  Anxious.  Tearful. Respiratory system: Clear to auscultation. Respiratory effort normal.  Mostly conducted airway sounds. Cardiovascular system: S1 & S2 heard, RRR. No JVD, murmurs, rubs, gallops or  clicks. No pedal edema. Gastrointestinal system: Abdomen is nondistended, soft and nontender. No organomegaly or masses felt. Normal bowel sounds heard. Central nervous system: Alert and oriented. No focal neurological deficits. Extremities: Symmetric 5 x 5 power. Skin: No rashes, lesions or ulcers Psychiatry: Judgement and insight appear normal. Mood & affect anxious and tearful.    Data Reviewed: I have personally reviewed following labs and imaging studies  CBC: Recent Labs  Lab 04/05/20 1421 04/06/20 0434  WBC 2.6* 1.8*  NEUTROABS 2.1 1.3*  HGB 13.2 12.1  HCT 38.3 36.6  MCV 82.4 84.9  PLT 193 162   Basic Metabolic Panel: Recent Labs  Lab 04/05/20 1421 04/06/20 0434  NA 135 138  K 3.1* 4.3  CL 97* 103  CO2 24 23  GLUCOSE 112* 132*  BUN 15 17  CREATININE 0.66 0.65  CALCIUM 8.2* 8.6*  MG 2.1  --    GFR: Estimated Creatinine Clearance: 77.8 mL/min (by C-G formula based on SCr of 0.65 mg/dL). Liver Function Tests: Recent Labs  Lab 04/05/20 1421 04/06/20 0434  AST 41 34  ALT 23 21  ALKPHOS 59 51  BILITOT 0.5 0.5  PROT 7.5 6.8  ALBUMIN 3.7 3.2*   No results for input(s): LIPASE, AMYLASE in the last 168 hours. No results for input(s): AMMONIA in the last 168 hours. Coagulation Profile: No results for input(s): INR, PROTIME in the last 168 hours. Cardiac Enzymes: No results for input(s): CKTOTAL, CKMB, CKMBINDEX, TROPONINI in the last 168 hours. BNP (last 3 results) No results for input(s): PROBNP in the last 8760 hours. HbA1C: No results for input(s): HGBA1C in the last 72 hours. CBG: No results for input(s): GLUCAP in the last 168 hours. Lipid Profile: Recent Labs    04/05/20 1421  TRIG 116   Thyroid Function Tests: No results for input(s): TSH, T4TOTAL, FREET4, T3FREE, THYROIDAB in the last 72 hours. Anemia Panel: Recent Labs    04/05/20 1421  FERRITIN 273   Sepsis Labs: Recent Labs  Lab 04/05/20 1421 04/06/20 0434  PROCALCITON <0.10   --   LATICACIDVEN 1.6 0.7    Recent Results (from the past 240 hour(s))  SARS Coronavirus 2 by RT PCR (hospital order, performed in [facility] hospital lab) Nasopharyngeal Nasopharyngeal Swab     Status: Abnormal   Collection Time: 04/05/20  2:21 PM   Specimen: Nasopharyngeal Swab  Result Value Ref Range Status   SARS Coronavirus 2 POSITIVE (A) NEGATIVE Final    Comment: RESULT CALLED TO, READ BACK BY AND VERIFIED WITH: K.ZULETA,RN [id number] @1543  BY V.WILKINS (NOTE) SARS-CoV-2 target nucleic acids are DETECTED  SARS-CoV-2 RNA is generally detectable in upper respiratory specimens  during the acute phase of infection.  Positive results are indicative  of the presence of the identified virus, but do not rule out bacterial  infection or co-infection with other pathogens not detected by the test.  Clinical correlation with patient history and  other diagnostic information is necessary to determine patient infection status.  The expected result is negative.  Fact Sheet for Patients:   BoilerBrush.com.cy   Fact Sheet for Healthcare Providers:   https://pope.com/    This test is not yet approved or cleared by the [place] FDA and  has been authorized for detection and/or diagnosis of SARS-CoV-2 by FDA under an Emergency Use Authorization (EUA).  This EUA will remain in effect (meaning this  test can be used) for the duration of  the COVID-19 declaration under Section 564(b)(1) of the Act, 21 U.S.C. section 360-bbb-3(b)(1), unless the authorization is terminated or revoked sooner.  Performed at [facility], [address], [place], [place] [address]   Blood Culture (routine x 2)     Status: None (Preliminary result)   Collection Time: 04/05/20  2:34 PM   Specimen: BLOOD  Result Value Ref Range Status   Specimen Description   Final    BLOOD SITE NOT SPECIFIED Performed at [facility], [address], [place], [place] [address]    Special Requests   Final    BOTTLES DRAWN AEROBIC AND ANAEROBIC Blood Culture adequate volume Performed at [facility], [address], [place], [place] [address]    Culture PENDING  Incomplete   Report Status PENDING  Incomplete  Blood Culture (routine x 2)     Status: None (Preliminary result)   Collection Time: 04/05/20  2:34 PM   Specimen: BLOOD  Result Value Ref Range Status   Specimen Description   Final    BLOOD SITE NOT SPECIFIED Performed at [facility], [address] [place], [place] [address]    Special Requests   Final    BOTTLES DRAWN AEROBIC AND ANAEROBIC Blood Culture adequate volume Performed at [facility], [address] [place], [place] [address]    Culture PENDING  Incomplete   Report Status PENDING  Incomplete         Radiology Studies: DG Chest Port 1 View  Result Date: 04/05/2020 CLINICAL DATA:  Dyspnea EXAM: PORTABLE CHEST 1 VIEW COMPARISON:  05/11/2017 chest radiograph and prior. FINDINGS: Hypoinflated lungs. Patchy bibasilar opacities. No pneumothorax or pleural effusion. Cardiomediastinal silhouette within normal limits. No acute osseous abnormality. IMPRESSION: Patchy bibasilar opacities, atelectasis versus infection. Electronically Signed   By: Stana Bunting M.D.   On: 04/05/2020 15:07        Scheduled Meds: . vitamin C  500 mg Oral Daily  . buPROPion  150 mg Oral Daily  . cholecalciferol  2,000 Units Oral QHS  . citalopram  40 mg Oral Daily  . cyproheptadine  2 mg Oral QHS  . enoxaparin (LOVENOX) injection  40 mg Subcutaneous Q24H  . Ipratropium-Albuterol  1 puff Inhalation Q6H  . levothyroxine  75 mcg Oral Daily  . methylPREDNISolone (SOLU-MEDROL) injection  0.5 mg/kg Intravenous Q12H   Followed by  . [START ON 04/08/2020] predniSONE  50 mg Oral QAC breakfast  . metoprolol tartrate  12.5 mg Oral QHS  . montelukast  10 mg Oral QHS  . pantoprazole  40 mg  Oral Daily  . zinc sulfate  220 mg Oral Daily   Continuous Infusions: . remdesivir 100 mg in NS 100 mL 100 mg (04/06/20 1021)     LOS: 1 day    Time spent: 35 minutes  Barb Merino, MD [facility] Pager [phone]

## 2020-04-06 NOTE — Progress Notes (Signed)
Initial Nutrition Assessment  RD working remotely.  DOCUMENTATION CODES:   Obesity unspecified  INTERVENTION:  - will order Ensure Enlive BID, each supplement provides 350 kcal and 20 grams of protein. - will order 30 ml Prosource Plus once/day, each supplement provides 100 kcal and 15 grams protein.  - will complete NFPE when feasible.  NUTRITION DIAGNOSIS:   Increased nutrient needs related to acute illness, catabolic illness (COVID-19 infection) as evidenced by estimated needs  GOAL:   Patient will meet greater than or equal to 90% of their needs  MONITOR:   PO intake, Supplement acceptance, Labs, Weight trends  REASON FOR ASSESSMENT:   Malnutrition Screening Tool  ASSESSMENT:   54 year old female with medical history of anxiety, depression, ADD, hypothyroidism, obesity, and hx of SVT. She presented to the ED with N/V, dizziness, weakness, and shortness of breath for ~9 days. She is unvaccinated against COVID-19. In the ED she had a fever of 103.46F and O2 sat of 80% on room air. She was found to be COVID-19 positive in the ED. Her husband and mother were also admitted with COVID-19 at the same time as her.  No intakes documented since admission. Weight yesterday was 180 lb (appears to be a stated weight) and PTA the most recently documented weight was on 09/18/18 when she weighed 195 lb. This indicates 15 lb weight loss (7.7% body weight) in the past 18 months; not significant for time frame, but unable to determine if weight loss occurred more acutely.   Per notes: - acute hypoxemic respiratory failure due to COVID-19   Labs reviewed; Ca: 8.6 mg/dl. Medications reviewed; 500 mg ascorbic acid/day, 2000 units cholecalciferol/day, 75 mcg oral synthroid/day, 40.6 mg solu-medrol BID 9/20-9/22, 40 mEq Klor-Con x2 doses 9/20, 200 mg IV remdesivir x1 dose 9/20, 100 mg IV remdesivir/day x4 days (9/21-9/24), 220 mg zinc sulfate/day.     NUTRITION - FOCUSED PHYSICAL EXAM:  unable  to complete at this time.  Diet Order:   Diet Order            Diet regular Room service appropriate? Yes; Fluid consistency: Thin  Diet effective now                 EDUCATION NEEDS:   No education needs have been identified at this time  Skin:  Skin Assessment: Reviewed RN Assessment  Last BM:  PTA/unknown  Height:   Ht Readings from Last 1 Encounters:  04/05/20 5\' 1"  (1.549 m)    Weight:   Wt Readings from Last 1 Encounters:  04/05/20 81.6 kg    Estimated Nutritional Needs:  Kcal:  1700-1950 Protein:  90-100 grams Fluid:  >/= 1.9 L/day     04/07/20, MS, RD, LDN, CNSC Inpatient Clinical Dietitian RD pager # available in [facility]  After hours/weekend pager # available in [facility]

## 2020-04-07 DIAGNOSIS — E039 Hypothyroidism, unspecified: Secondary | ICD-10-CM

## 2020-04-07 DIAGNOSIS — I471 Supraventricular tachycardia: Secondary | ICD-10-CM

## 2020-04-07 DIAGNOSIS — U071 COVID-19: Secondary | ICD-10-CM

## 2020-04-07 LAB — COMPREHENSIVE METABOLIC PANEL
ALT: 22 U/L (ref 0–44)
AST: 30 U/L (ref 15–41)
Albumin: 3 g/dL — ABNORMAL LOW (ref 3.5–5.0)
Alkaline Phosphatase: 47 U/L (ref 38–126)
Anion gap: 11 (ref 5–15)
BUN: 23 mg/dL — ABNORMAL HIGH (ref 6–20)
CO2: 22 mmol/L (ref 22–32)
Calcium: 8.1 mg/dL — ABNORMAL LOW (ref 8.9–10.3)
Chloride: 103 mmol/L (ref 98–111)
Creatinine, Ser: 0.59 mg/dL (ref 0.44–1.00)
GFR calc Af Amer: >60 mL/min (ref >60)
GFR calc non Af Amer: >60 mL/min (ref >60)
Glucose, Bld: 152 mg/dL — ABNORMAL HIGH (ref 70–99)
Potassium: 4.4 mmol/L (ref 3.5–5.1)
Sodium: 136 mmol/L (ref 135–145)
Total Bilirubin: 0.4 mg/dL (ref 0.3–1.2)
Total Protein: 6.5 g/dL (ref 6.5–8.1)

## 2020-04-07 LAB — CBC WITH DIFFERENTIAL/PLATELET
Abs Immature Granulocytes: 0.03 10*3/uL (ref 0.00–0.07)
Basophils Absolute: 0 10*3/uL (ref 0.0–0.1)
Basophils Relative: 0 %
Eosinophils Absolute: 0 10*3/uL (ref 0.0–0.5)
Eosinophils Relative: 0 %
HCT: 37.5 % (ref 36.0–46.0)
Hemoglobin: 12.5 g/dL (ref 12.0–15.0)
Immature Granulocytes: 1 %
Lymphocytes Relative: 10 %
Lymphs Abs: 0.5 10*3/uL — ABNORMAL LOW (ref 0.7–4.0)
MCH: 28.4 pg (ref 26.0–34.0)
MCHC: 33.3 g/dL (ref 30.0–36.0)
MCV: 85.2 fL (ref 80.0–100.0)
Monocytes Absolute: 0.4 10*3/uL (ref 0.1–1.0)
Monocytes Relative: 7 %
Neutro Abs: 4 10*3/uL (ref 1.7–7.7)
Neutrophils Relative %: 82 %
Platelets: 182 10*3/uL (ref 150–400)
RBC: 4.4 MIL/uL (ref 3.87–5.11)
RDW: 14.4 % (ref 11.5–15.5)
WBC: 4.9 10*3/uL (ref 4.0–10.5)
nRBC: 0 % (ref 0.0–0.2)

## 2020-04-07 LAB — D-DIMER, QUANTITATIVE: D-Dimer, Quant: 0.52 ug/mL-FEU — ABNORMAL HIGH (ref 0.00–0.50)

## 2020-04-07 LAB — C-REACTIVE PROTEIN: CRP: 2.3 mg/dL — ABNORMAL HIGH (ref <1.0)

## 2020-04-07 MED ORDER — METHYLPREDNISOLONE SODIUM SUCC 125 MG IJ SOLR
40.0000 mg | Freq: Two times a day (BID) | INTRAMUSCULAR | Status: DC
  Administered 2020-04-07 – 2020-04-11 (×8): 40 mg via INTRAVENOUS
  Filled 2020-04-07 (×8): qty 2

## 2020-04-07 MED ORDER — ALBUTEROL SULFATE HFA 108 (90 BASE) MCG/ACT IN AERS: 1.0000 | INHALATION_SPRAY | RESPIRATORY_TRACT | Status: DC | PRN

## 2020-04-07 NOTE — Progress Notes (Addendum)
PROGRESS NOTE    [name]  BVQ:[id number] DOB: [birth year] DOA: 04/05/2020 PCP: Clayborn Heron, MD    Brief Narrative:  Patient admitted to the hospital with working diagnosis of acute hypoxic respiratory failure due to SARS COVID-19 viral pneumonia.  54 year old female with past medical history for anxiety, depression, hypothyroidism, obesity, SVT and attention deficit who presented with nausea, vomiting, dizziness, weakness, dyspnea and cough.  Symptoms present for about 9 days.  Positive COVID-19 contact at home, she has been not vaccinated.  On her initial physical examination she was febrile 103.2 F, she was tachypneic 29 breaths/min, her oxygen saturation was in the mid 80s on room air.  Her lungs had no wheezing but bilateral rhonchi, heart S1-S2, present rhythmic, soft abdomen, no lower extremity edema.  Chest radiograph with bilateral lower lobe interstitial infiltrates, peripherally.   Assessment & Plan:   Principal Problem:   Acute hypoxemic respiratory failure due to COVID-19 [facility]) Active Problems:   Depression   Seasonal allergies   Hypothyroidism   Pneumonia due to COVID-19 virus   SVT (supraventricular tachycardia) (HCC)   1.  Acute hypoxic respiratory failure due to SARS COVID-19 viral pneumonia.  RR: 18  Pulse oxymetry: 91%  Fi02: 4 L/ min per HFNC  COVID-19 Labs  Recent Labs    04/05/20 1421 04/06/20 0434 04/07/20 0527  DDIMER 0.88* 0.65* 0.52*  FERRITIN 273  --   --   LDH 296*  --   --   CRP 6.7* 6.7* 2.3*    Lab Results  Component Value Date   SARSCOV2NAA POSITIVE (A) 04/05/2020    Inflammatory markers are trending down, patient continue to have dyspnea worse with exertion.  Continue medical therapy with remdesivir and systemic steroids, bronchodilators and antitussive agents. Continue out of bed and airway clearing techniques with incentive spirometer. Patient can stay prone, encourage lateral decubitus.   2. Hypokalemia this am K  up to 4,3 with preserved renal function. Continue close monitoring of electrolyte.   3. Hx SVT. Controlled with metoprolol.   4. Anxiety and depression. No agitation or confusion, continue with bupropion and citalopram  5. Hypothyroid. Continue with levothyroxine.   Patient continue to be at high risk for worsening respiratory failire   Status is: Inpatient  Remains inpatient appropriate because:IV treatments appropriate due to intensity of illness or inability to take PO   Dispo:  Patient From: Home  Planned Disposition: Home with Health Care Svc  Expected discharge date:   Medically stable for discharge: No   DVT prophylaxis: Enoxaparin   Code Status:   full  Family Communication:  No family at the bedside      Nutrition Status: Nutrition Problem: Increased nutrient needs Etiology: acute illness, catabolic illness (COVID-19 infection) Signs/Symptoms: estimated needs Interventions: Ensure Enlive (each supplement provides 350kcal and 20 grams of protein), Other (Comment) (Prosource Plus)      Subjective: Patient continue to have dyspnea, worse with exertion, no chest pain, no nausea or vomiting.   Objective: Vitals:   04/06/20 1753 04/06/20 2059 04/07/20 0613 04/07/20 1149  BP:  (!) 143/95 116/61 122/74  Pulse:  88 61 67  Resp:  (!) 22 18 18   Temp:  99.6 F (37.6 C) 98.6 F (37 C) 98.9 F (37.2 C)  TempSrc:  Oral Oral Oral  SpO2: 91% (!) 89% 92% 91%  Weight:      Height:        Intake/Output Summary (Last 24 hours) at 04/07/2020 1349 Last data filed at 04/06/2020  2100 Gross per 24 hour  Intake 240 ml  Output --  Net 240 ml   Filed Weights   04/05/20 1413  Weight: 81.6 kg    Examination:   General: Not in pain or dyspnea, deconditioned  Neurology: Awake and alert, non focal  E ENT: mild pallor, no icterus, oral mucosa moist Cardiovascular: No JVD. S1-S2 present, rhythmic, no gallops, rubs, or murmurs. No lower extremity edema. Pulmonary: positive  breath sounds bilaterally, Gastrointestinal. Abdomen soft and non tender Skin. No rashes Musculoskeletal: no joint deformities     Data Reviewed: I have personally reviewed following labs and imaging studies  CBC: Recent Labs  Lab 04/05/20 1421 04/06/20 0434 04/07/20 0527  WBC 2.6* 1.8* 4.9  NEUTROABS 2.1 1.3* 4.0  HGB 13.2 12.1 12.5  HCT 38.3 36.6 37.5  MCV 82.4 84.9 85.2  PLT 193 162 182   Basic Metabolic Panel: Recent Labs  Lab 04/05/20 1421 04/06/20 0434 04/07/20 0527  NA 135 138 136  K 3.1* 4.3 4.4  CL 97* 103 103  CO2 24 23 22   GLUCOSE 112* 132* 152*  BUN 15 17 23*  CREATININE 0.66 0.65 0.59  CALCIUM 8.2* 8.6* 8.1*  MG 2.1  --   --    GFR: Estimated Creatinine Clearance: 77.8 mL/min (by C-G formula based on SCr of 0.59 mg/dL). Liver Function Tests: Recent Labs  Lab 04/05/20 1421 04/06/20 0434 04/07/20 0527  AST 41 34 30  ALT 23 21 22   ALKPHOS 59 51 47  BILITOT 0.5 0.5 0.4  PROT 7.5 6.8 6.5  ALBUMIN 3.7 3.2* 3.0*   No results for input(s): LIPASE, AMYLASE in the last 168 hours. No results for input(s): AMMONIA in the last 168 hours. Coagulation Profile: No results for input(s): INR, PROTIME in the last 168 hours. Cardiac Enzymes: No results for input(s): CKTOTAL, CKMB, CKMBINDEX, TROPONINI in the last 168 hours. BNP (last 3 results) No results for input(s): PROBNP in the last 8760 hours. HbA1C: No results for input(s): HGBA1C in the last 72 hours. CBG: No results for input(s): GLUCAP in the last 168 hours. Lipid Profile: Recent Labs    04/05/20 1421  TRIG 116   Thyroid Function Tests: No results for input(s): TSH, T4TOTAL, FREET4, T3FREE, THYROIDAB in the last 72 hours. Anemia Panel: Recent Labs    04/05/20 1421  FERRITIN 273      Radiology Studies: I have reviewed all of the imaging during this hospital visit personally     Scheduled Meds: . (feeding supplement) PROSource Plus  30 mL Oral Daily  . vitamin C  500 mg Oral  Daily  . buPROPion  150 mg Oral Daily  . cholecalciferol  2,000 Units Oral QHS  . citalopram  40 mg Oral Daily  . cyproheptadine  2 mg Oral QHS  . enoxaparin (LOVENOX) injection  40 mg Subcutaneous Q24H  . feeding supplement (ENSURE ENLIVE)  237 mL Oral BID BM  . Ipratropium-Albuterol  1 puff Inhalation Q6H  . levothyroxine  75 mcg Oral Daily  . methylPREDNISolone (SOLU-MEDROL) injection  0.5 mg/kg Intravenous Q12H   Followed by  . [START ON 04/08/2020] predniSONE  50 mg Oral QAC breakfast  . metoprolol tartrate  12.5 mg Oral QHS  . montelukast  10 mg Oral QHS  . pantoprazole  40 mg Oral Daily  . zinc sulfate  220 mg Oral Daily   Continuous Infusions: . remdesivir 100 mg in NS 100 mL 100 mg (04/07/20 0950)     LOS: 2  days        Danyetta Gillham Gerome Apley, MD

## 2020-04-07 NOTE — Plan of Care (Signed)
Patient c/o cough, PRN medication given. Vitals stable on 4L HFNC. Denies any pain at this time. Will continue to monitor.    Problem: Education: Goal: Knowledge of General Education information will improve Description: Including pain rating scale, medication(s)/side effects and non-pharmacologic comfort measures Outcome: Progressing   Problem: Health Behavior/Discharge Planning: Goal: Ability to manage health-related needs will improve Outcome: Progressing   Problem: Clinical Measurements: Goal: Ability to maintain clinical measurements within normal limits will improve Outcome: Progressing Goal: Will remain free from infection Outcome: Progressing Goal: Diagnostic test results will improve Outcome: Progressing Goal: Respiratory complications will improve Outcome: Progressing Goal: Cardiovascular complication will be avoided Outcome: Progressing   Problem: Activity: Goal: Risk for activity intolerance will decrease Outcome: Progressing   Problem: Nutrition: Goal: Adequate nutrition will be maintained Outcome: Progressing   Problem: Coping: Goal: Level of anxiety will decrease Outcome: Progressing   Problem: Elimination: Goal: Will not experience complications related to bowel motility Outcome: Progressing Goal: Will not experience complications related to urinary retention Outcome: Progressing   Problem: Pain Managment: Goal: General experience of comfort will improve Outcome: Progressing   Problem: Safety: Goal: Ability to remain free from injury will improve Outcome: Progressing   Problem: Skin Integrity: Goal: Risk for impaired skin integrity will decrease Outcome: Progressing   Problem: Education: Goal: Knowledge of risk factors and measures for prevention of condition will improve Outcome: Progressing   Problem: Coping: Goal: Psychosocial and spiritual needs will be supported Outcome: Progressing   Problem: Respiratory: Goal: Will maintain a  patent airway Outcome: Progressing Goal: Complications related to the disease process, condition or treatment will be avoided or minimized Outcome: Progressing

## 2020-04-08 DIAGNOSIS — J302 Other seasonal allergic rhinitis: Secondary | ICD-10-CM

## 2020-04-08 DIAGNOSIS — E038 Other specified hypothyroidism: Secondary | ICD-10-CM

## 2020-04-08 LAB — FERRITIN: Ferritin: 346 ng/mL — ABNORMAL HIGH (ref 11–307)

## 2020-04-08 LAB — COMPREHENSIVE METABOLIC PANEL
ALT: 23 U/L (ref 0–44)
AST: 29 U/L (ref 15–41)
Albumin: 3 g/dL — ABNORMAL LOW (ref 3.5–5.0)
Alkaline Phosphatase: 56 U/L (ref 38–126)
Anion gap: 12 (ref 5–15)
BUN: 17 mg/dL (ref 6–20)
CO2: 25 mmol/L (ref 22–32)
Calcium: 8.7 mg/dL — ABNORMAL LOW (ref 8.9–10.3)
Chloride: 101 mmol/L (ref 98–111)
Creatinine, Ser: 0.57 mg/dL (ref 0.44–1.00)
GFR calc Af Amer: >60 mL/min (ref >60)
GFR calc non Af Amer: >60 mL/min (ref >60)
Glucose, Bld: 149 mg/dL — ABNORMAL HIGH (ref 70–99)
Potassium: 4 mmol/L (ref 3.5–5.1)
Sodium: 138 mmol/L (ref 135–145)
Total Bilirubin: 0.5 mg/dL (ref 0.3–1.2)
Total Protein: 6.3 g/dL — ABNORMAL LOW (ref 6.5–8.1)

## 2020-04-08 LAB — C-REACTIVE PROTEIN: CRP: 0.8 mg/dL (ref <1.0)

## 2020-04-08 LAB — D-DIMER, QUANTITATIVE: D-Dimer, Quant: 0.67 ug/mL-FEU — ABNORMAL HIGH (ref 0.00–0.50)

## 2020-04-08 MED ORDER — IPRATROPIUM-ALBUTEROL 20-100 MCG/ACT IN AERS
1.0000 | INHALATION_SPRAY | Freq: Three times a day (TID) | RESPIRATORY_TRACT | Status: DC
  Administered 2020-04-09 – 2020-04-13 (×14): 1 via RESPIRATORY_TRACT
  Filled 2020-04-08: qty 4

## 2020-04-08 NOTE — Progress Notes (Signed)
PROGRESS NOTE    [name]  KNL:[id number] DOB: 26-Jun-1966 DOA: 04/05/2020 PCP: Clayborn Heron, MD    Brief Narrative:  Patient admitted to the hospital with working diagnosis of acute hypoxic respiratory failure due to SARS COVID-19 viral pneumonia.  54 year old female with past medical history for anxiety, depression, hypothyroidism, obesity, SVT and attention deficit who presented with nausea, vomiting, dizziness, weakness, dyspnea and cough.  Symptoms present for about 9 days.  Positive COVID-19 contact at home, she has been not vaccinated.  On her initial physical examination she was febrile 103.2 F, she was tachypneic 29 breaths/min, her oxygen saturation was in the mid 80s on room air.  Her lungs had no wheezing but bilateral rhonchi, heart S1-S2, present rhythmic, soft abdomen, no lower extremity edema.  Chest radiograph with bilateral lower lobe interstitial infiltrates, peripherally.   Assessment & Plan:   Principal Problem:   Acute hypoxemic respiratory failure due to COVID-19 [facility]) Active Problems:   Depression   Seasonal allergies   Hypothyroidism   Pneumonia due to COVID-19 virus   SVT (supraventricular tachycardia) (HCC)   1.  Acute hypoxic respiratory failure due to SARS COVID-19 viral pneumonia.  RR: 18 Pulse oxymetry: 90% Fi02: 5 L/min   COVID-19 Labs  Recent Labs    04/05/20 1421 04/05/20 1421 04/06/20 0434 04/07/20 0527 04/08/20 0548  DDIMER 0.88*   < > 0.65* 0.52* 0.67*  FERRITIN 273  --   --   --  346*  LDH 296*  --   --   --   --   CRP 6.7*   < > 6.7* 2.3* 0.8   < > = values in this interval not displayed.    Lab Results  Component Value Date   SARSCOV2NAA POSITIVE (A) 04/05/2020    Patient had severe dyspnea last night, this am improved but not yet back to baseline. Inflammatory markers with CRP trending down, but continue to be elevated ferritin and d dimer.   Medical therapy with remdesivir, systemic steroids, bronchodilators  and antitussive agents. Continue airway clearing techniques and out of bed to chair tid with meal, lateral decubitus (not tolerating prone).  Continue to follow up on inflammatory markers.   2. Hypokalemia Clinically resolved with K at 4,0 with preserved renal function, cr at 0,57 and plasma bicarbonate at 25.    3. Hx SVT. On metoprolol with rate controlled.   4. Anxiety and depression. Mild anxiety but controlled with bupropion and citalopram  5. Hypothyroid. On levothyroxine.   Patient continue to be at high risk for worsening respiratory failire    Status is: Inpatient  Remains inpatient appropriate because:IV treatments appropriate due to intensity of illness or inability to take PO   Dispo:  Patient From: Home  Planned Disposition: Home with Health Care Svc  Expected discharge date: 04/11/20  Medically stable for discharge: No   DVT prophylaxis: Enoxaparin  Code Status:   full  Family Communication:  All close family hospitalized for COVID      Nutrition Status: Nutrition Problem: Increased nutrient needs Etiology: acute illness, catabolic illness (COVID-19 infection) Signs/Symptoms: estimated needs Interventions: Ensure Enlive (each supplement provides 350kcal and 20 grams of protein), Other (Comment) (Prosource Plus)       Subjective: Patient had severe dyspnea yesterday but improved today, not yet back to baseline, continue to be very fatigue and weak, no nausea or vomiting, no chest pain.   Objective: Vitals:   04/07/20 1720 04/07/20 1852 04/07/20 2053 04/08/20 0527  BP:  [name])  148/87 (!) 151/83 121/71  Pulse:  72 82 61  Resp:  20 20 18   Temp:  99.4 F (37.4 C) 99.1 F (37.3 C) 98.5 F (36.9 C)  TempSrc:  Oral Oral Oral  SpO2: 90% (!) 86% (!) 89% 90%  Weight:      Height:        Intake/Output Summary (Last 24 hours) at 04/08/2020 0857 Last data filed at 04/07/2020 2117 Gross per 24 hour  Intake 237 ml  Output 300 ml  Net -63 ml   Filed  Weights   04/05/20 1413  Weight: 81.6 kg    Examination:   General: Not in pain or dyspnea, deconditioned  Neurology: Awake and alert, non focal  E ENT: mild pallor, no icterus, oral mucosa moist Cardiovascular: No JVD. S1-S2 present, rhythmic, no gallops, rubs, or murmurs. No lower extremity edema. Pulmonary: positive breath sounds bilaterally, with no wheezing, rhonchi or rales. Gastrointestinal. Abdomen soft and non tender Skin. No rashes Musculoskeletal: no joint deformities     Data Reviewed: I have personally reviewed following labs and imaging studies  CBC: Recent Labs  Lab 04/05/20 1421 04/06/20 0434 04/07/20 0527  WBC 2.6* 1.8* 4.9  NEUTROABS 2.1 1.3* 4.0  HGB 13.2 12.1 12.5  HCT 38.3 36.6 37.5  MCV 82.4 84.9 85.2  PLT 193 162 182   Basic Metabolic Panel: Recent Labs  Lab 04/05/20 1421 04/06/20 0434 04/07/20 0527 04/08/20 0548  NA 135 138 136 138  K 3.1* 4.3 4.4 4.0  CL 97* 103 103 101  CO2 24 23 22 25   GLUCOSE 112* 132* 152* 149*  BUN 15 17 23* 17  CREATININE 0.66 0.65 0.59 0.57  CALCIUM 8.2* 8.6* 8.1* 8.7*  MG 2.1  --   --   --    GFR: Estimated Creatinine Clearance: 77.8 mL/min (by C-G formula based on SCr of 0.57 mg/dL). Liver Function Tests: Recent Labs  Lab 04/05/20 1421 04/06/20 0434 04/07/20 0527 04/08/20 0548  AST 41 34 30 29  ALT 23 21 22 23   ALKPHOS 59 51 47 56  BILITOT 0.5 0.5 0.4 0.5  PROT 7.5 6.8 6.5 6.3*  ALBUMIN 3.7 3.2* 3.0* 3.0*   No results for input(s): LIPASE, AMYLASE in the last 168 hours. No results for input(s): AMMONIA in the last 168 hours. Coagulation Profile: No results for input(s): INR, PROTIME in the last 168 hours. Cardiac Enzymes: No results for input(s): CKTOTAL, CKMB, CKMBINDEX, TROPONINI in the last 168 hours. BNP (last 3 results) No results for input(s): PROBNP in the last 8760 hours. HbA1C: No results for input(s): HGBA1C in the last 72 hours. CBG: No results for input(s): GLUCAP in the last  168 hours. Lipid Profile: Recent Labs    04/05/20 1421  TRIG 116   Thyroid Function Tests: No results for input(s): TSH, T4TOTAL, FREET4, T3FREE, THYROIDAB in the last 72 hours. Anemia Panel: Recent Labs    04/05/20 1421 04/08/20 0548  FERRITIN 273 346*      Radiology Studies: I have reviewed all of the imaging during this hospital visit personally     Scheduled Meds: . (feeding supplement) PROSource Plus  30 mL Oral Daily  . vitamin C  500 mg Oral Daily  . buPROPion  150 mg Oral Daily  . cholecalciferol  2,000 Units Oral QHS  . citalopram  40 mg Oral Daily  . cyproheptadine  2 mg Oral QHS  . feeding supplement (ENSURE ENLIVE)  237 mL Oral BID BM  . Ipratropium-Albuterol  1 puff Inhalation Q6H  . levothyroxine  75 mcg Oral Daily  . methylPREDNISolone (SOLU-MEDROL) injection  40 mg Intravenous Q12H  . metoprolol tartrate  12.5 mg Oral QHS  . montelukast  10 mg Oral QHS  . pantoprazole  40 mg Oral Daily  . zinc sulfate  220 mg Oral Daily   Continuous Infusions: . remdesivir 100 mg in NS 100 mL 100 mg (04/07/20 0950)     LOS: 3 days        Kenedy Haisley [name] Gula, MD

## 2020-04-08 NOTE — Plan of Care (Signed)
No signs or symptoms of sepsis at this time Problem: Education: Goal: Knowledge of General Education information will improve Description: Including pain rating scale, medication(s)/side effects and non-pharmacologic comfort measures Outcome: Progressing   Problem: Health Behavior/Discharge Planning: Goal: Ability to manage health-related needs will improve Outcome: Progressing   Problem: Clinical Measurements: Goal: Ability to maintain clinical measurements within normal limits will improve Outcome: Progressing Goal: Will remain free from infection Outcome: Progressing Goal: Respiratory complications will improve Outcome: Progressing

## 2020-04-09 ENCOUNTER — Other Ambulatory Visit: Payer: Self-pay

## 2020-04-09 LAB — D-DIMER, QUANTITATIVE: D-Dimer, Quant: 0.89 ug/mL-FEU — ABNORMAL HIGH (ref 0.00–0.50)

## 2020-04-09 LAB — COMPREHENSIVE METABOLIC PANEL
ALT: 23 U/L (ref 0–44)
AST: 21 U/L (ref 15–41)
Albumin: 2.9 g/dL — ABNORMAL LOW (ref 3.5–5.0)
Alkaline Phosphatase: 53 U/L (ref 38–126)
Anion gap: 12 (ref 5–15)
BUN: 23 mg/dL — ABNORMAL HIGH (ref 6–20)
CO2: 26 mmol/L (ref 22–32)
Calcium: 8.4 mg/dL — ABNORMAL LOW (ref 8.9–10.3)
Chloride: 103 mmol/L (ref 98–111)
Creatinine, Ser: 0.5 mg/dL (ref 0.44–1.00)
GFR calc Af Amer: >60 mL/min (ref >60)
GFR calc non Af Amer: >60 mL/min (ref >60)
Glucose, Bld: 146 mg/dL — ABNORMAL HIGH (ref 70–99)
Potassium: 3.7 mmol/L (ref 3.5–5.1)
Sodium: 141 mmol/L (ref 135–145)
Total Bilirubin: 0.3 mg/dL (ref 0.3–1.2)
Total Protein: 5.9 g/dL — ABNORMAL LOW (ref 6.5–8.1)

## 2020-04-09 LAB — FERRITIN: Ferritin: 256 ng/mL (ref 11–307)

## 2020-04-09 LAB — C-REACTIVE PROTEIN: CRP: 0.7 mg/dL (ref <1.0)

## 2020-04-09 MED ORDER — ENOXAPARIN SODIUM 40 MG/0.4ML ~~LOC~~ SOLN
40.0000 mg | Freq: Every day | SUBCUTANEOUS | Status: DC
  Administered 2020-04-09 – 2020-04-12 (×4): 40 mg via SUBCUTANEOUS
  Filled 2020-04-09 (×5): qty 0.4

## 2020-04-09 NOTE — Progress Notes (Signed)
Pt states she hasnt had a heparin shot lately. Dr note mentions lovenox. No anticoagulation currently ordered. Please review.

## 2020-04-09 NOTE — Progress Notes (Signed)
PROGRESS NOTE    Jozi Malachi  QMG:[id number] DOB: Jan 30, 1966 DOA: 04/05/2020 PCP: Clayborn Heron, MD    Brief Narrative:  Patient admitted to the hospital with working diagnosis of acute hypoxic respiratory failure due to SARS COVID-19 viral pneumonia.  54 year old female with past medical history for anxiety, depression, hypothyroidism, obesity, SVT and attention deficit who presented with nausea, vomiting, dizziness, weakness, dyspnea and cough. Symptoms present for about 9 days. Positive COVID-19 contact at home, she has been not vaccinated. On her initial physical examination she was febrile 103.2 F, she was tachypneic 29 breaths/min, her oxygen saturation was in the mid 80s on room air. Her lungs had no wheezing but bilateral rhonchi, heart S1-S2, present rhythmic, soft abdomen, no lower extremity edema.  Chest radiograph with bilateral lower lobe interstitial infiltrates, peripherally.  Continue to have persistent dyspnea with minimal efforts, worse at night.    Assessment & Plan:   Principal Problem:   Acute hypoxemic respiratory failure due to COVID-19 [facility]) Active Problems:   Depression   Seasonal allergies   Hypothyroidism   Pneumonia due to COVID-19 virus   SVT (supraventricular tachycardia) (HCC)   1.Acute hypoxic respiratory failure due to SARS COVID-19 viral pneumonia.  RR: 20  Pulse oxymetry: 88 to 89%  Fi02: 5 L/ min  [place]   COVID-19 Labs  Recent Labs    04/07/20 0527 04/08/20 0548 04/09/20 0525  DDIMER 0.52* 0.67* 0.89*  FERRITIN  --  346* 256  CRP 2.3* 0.8 0.7    Lab Results  Component Value Date   SARSCOV2NAA POSITIVE (A) 04/05/2020   Inflammatory markers are trending down, except d dimer. Continue on 5 L per min per [place]. Persistent dyspnea.   Continue medical therapy with systemic steroids, bronchodilators and antitussive agents. She has completed remdesivir regimen today #5/5. Doing airway clearing techniques and getting out of bed  to chair tid with meal, lateral decubitus (not tolerating prone).  Keep oxygenation more than 88% and continue to follow up on inflammatory markers.  2. HypokalemiaPatient is tolerating po well, K is 3,7 with bicarbonate at 26, with renal function stable with serum cr at 0,50.  3. Hx SVT. Controlled with metoprolol.   4. Anxiety and depression. On bupropion and citalopram.   5. Hypothyroid. Continue with  levothyroxine.   Status is: Inpatient  Remains inpatient appropriate because:IV treatments appropriate due to intensity of illness or inability to take PO and Inpatient level of care appropriate due to severity of illness   Dispo:  Patient From: Home  Planned Disposition: Home with Health Care Svc  Expected discharge date:   Medically stable for discharge: No    DVT prophylaxis: Enoxaparin   Code Status:   full  Family Communication:  No family at the bedside (husband and mother in law hospitalized for COVID)      Nutrition Status: Nutrition Problem: Increased nutrient needs Etiology: acute illness, catabolic illness (COVID-19 infection) Signs/Symptoms: estimated needs Interventions: Ensure Enlive (each supplement provides 350kcal and 20 grams of protein), Other (Comment) (Prosource Plus)       Subjective: Patient continue to have dyspnea, worse with minimal efforts, continue to have fatigue. Tolerating po well, with no nausea or vomiting.   Objective: Vitals:   04/08/20 0527 04/08/20 1405 04/08/20 2018 04/09/20 0515  BP: 121/71 (!) 142/85 (!) 150/82 (!) 155/85  Pulse: 61 75 71 (!) 58  Resp: 18 15 18 20   Temp: 98.5 F (36.9 C) 98.6 F (37 C) 99.2 F (37.3 C) 98 F (  36.7 C)  TempSrc: Oral Oral Oral Oral  SpO2: 90% (!) 88% (!) 86% (!) 89%  Weight:      Height:        Intake/Output Summary (Last 24 hours) at 04/09/2020 1008 Last data filed at 04/09/2020 0600 Gross per 24 hour  Intake 237 ml  Output 250 ml  Net -13 ml   Filed Weights   04/05/20  1413  Weight: 81.6 kg    Examination:   General: Not in pain or dyspnea, deconditioned  Neurology: Awake and alert, non focal  E ENT: no pallor, no icterus, oral mucosa moist Cardiovascular: No JVD. S1-S2 present, rhythmic, no gallops, rubs, or murmurs. No lower extremity edema. Pulmonary: positive breath sounds bilaterally, Gastrointestinal. Abdomen soft and non tender Skin. No rashes Musculoskeletal: no joint deformities     Data Reviewed: I have personally reviewed following labs and imaging studies  CBC: Recent Labs  Lab 04/05/20 1421 04/06/20 0434 04/07/20 0527  WBC 2.6* 1.8* 4.9  NEUTROABS 2.1 1.3* 4.0  HGB 13.2 12.1 12.5  HCT 38.3 36.6 37.5  MCV 82.4 84.9 85.2  PLT 193 162 182   Basic Metabolic Panel: Recent Labs  Lab 04/05/20 1421 04/06/20 0434 04/07/20 0527 04/08/20 0548 04/09/20 0525  NA 135 138 136 138 141  K 3.1* 4.3 4.4 4.0 3.7  CL 97* 103 103 101 103  CO2 24 23 22 25 26   GLUCOSE 112* 132* 152* 149* 146*  BUN 15 17 23* 17 23*  CREATININE 0.66 0.65 0.59 0.57 0.50  CALCIUM 8.2* 8.6* 8.1* 8.7* 8.4*  MG 2.1  --   --   --   --    GFR: Estimated Creatinine Clearance: 77.8 mL/min (by C-G formula based on SCr of 0.5 mg/dL). Liver Function Tests: Recent Labs  Lab 04/05/20 1421 04/06/20 0434 04/07/20 0527 04/08/20 0548 04/09/20 0525  AST 41 34 30 29 21   ALT 23 21 22 23 23   ALKPHOS 59 51 47 56 53  BILITOT 0.5 0.5 0.4 0.5 0.3  PROT 7.5 6.8 6.5 6.3* 5.9*  ALBUMIN 3.7 3.2* 3.0* 3.0* 2.9*   No results for input(s): LIPASE, AMYLASE in the last 168 hours. No results for input(s): AMMONIA in the last 168 hours. Coagulation Profile: No results for input(s): INR, PROTIME in the last 168 hours. Cardiac Enzymes: No results for input(s): CKTOTAL, CKMB, CKMBINDEX, TROPONINI in the last 168 hours. BNP (last 3 results) No results for input(s): PROBNP in the last 8760 hours. HbA1C: No results for input(s): HGBA1C in the last 72 hours. CBG: No results  for input(s): GLUCAP in the last 168 hours. Lipid Profile: No results for input(s): CHOL, HDL, LDLCALC, TRIG, CHOLHDL, LDLDIRECT in the last 72 hours. Thyroid Function Tests: No results for input(s): TSH, T4TOTAL, FREET4, T3FREE, THYROIDAB in the last 72 hours. Anemia Panel: Recent Labs    04/08/20 0548 04/09/20 0525  FERRITIN 346* 256      Radiology Studies: I have reviewed all of the imaging during this hospital visit personally     Scheduled Meds: . (feeding supplement) PROSource Plus  30 mL Oral Daily  . vitamin C  500 mg Oral Daily  . buPROPion  150 mg Oral Daily  . cholecalciferol  2,000 Units Oral QHS  . citalopram  40 mg Oral Daily  . cyproheptadine  2 mg Oral QHS  . feeding supplement (ENSURE ENLIVE)  237 mL Oral BID BM  . Ipratropium-Albuterol  1 puff Inhalation TID  . levothyroxine  75 mcg  Oral Daily  . methylPREDNISolone (SOLU-MEDROL) injection  40 mg Intravenous Q12H  . metoprolol tartrate  12.5 mg Oral QHS  . montelukast  10 mg Oral QHS  . pantoprazole  40 mg Oral Daily  . zinc sulfate  220 mg Oral Daily   Continuous Infusions: . remdesivir 100 mg in NS 100 mL 100 mg (04/09/20 0958)     LOS: 4 days        Leinani Lisbon [name] Gula, MD

## 2020-04-09 NOTE — Plan of Care (Signed)
No signs or symptoms of sepsis at this time.  Problem: Education: Goal: Knowledge of General Education information will improve Description: Including pain rating scale, medication(s)/side effects and non-pharmacologic comfort measures Outcome: Progressing   Problem: Health Behavior/Discharge Planning: Goal: Ability to manage health-related needs will improve Outcome: Progressing   Problem: Clinical Measurements: Goal: Ability to maintain clinical measurements within normal limits will improve Outcome: Progressing Goal: Will remain free from infection Outcome: Progressing Goal: Diagnostic test results will improve Outcome: Progressing Goal: Respiratory complications will improve Outcome: Progressing   Problem: Activity: Goal: Risk for activity intolerance will decrease Outcome: Progressing   Problem: Nutrition: Goal: Adequate nutrition will be maintained Outcome: Progressing   Problem: Coping: Goal: Level of anxiety will decrease Outcome: Progressing

## 2020-04-10 ENCOUNTER — Inpatient Hospital Stay (HOSPITAL_COMMUNITY): Payer: BC Managed Care – PPO

## 2020-04-10 LAB — COMPREHENSIVE METABOLIC PANEL
ALT: 27 U/L (ref 0–44)
AST: 22 U/L (ref 15–41)
Albumin: 3 g/dL — ABNORMAL LOW (ref 3.5–5.0)
Alkaline Phosphatase: 51 U/L (ref 38–126)
Anion gap: 11 (ref 5–15)
BUN: 18 mg/dL (ref 6–20)
CO2: 27 mmol/L (ref 22–32)
Calcium: 8.5 mg/dL — ABNORMAL LOW (ref 8.9–10.3)
Chloride: 101 mmol/L (ref 98–111)
Creatinine, Ser: 0.5 mg/dL (ref 0.44–1.00)
GFR calc Af Amer: >60 mL/min (ref >60)
GFR calc non Af Amer: >60 mL/min (ref >60)
Glucose, Bld: 151 mg/dL — ABNORMAL HIGH (ref 70–99)
Potassium: 3.7 mmol/L (ref 3.5–5.1)
Sodium: 139 mmol/L (ref 135–145)
Total Bilirubin: 0.3 mg/dL (ref 0.3–1.2)
Total Protein: 5.9 g/dL — ABNORMAL LOW (ref 6.5–8.1)

## 2020-04-10 LAB — CULTURE, BLOOD (ROUTINE X 2)
Culture: NO GROWTH
Culture: NO GROWTH
Special Requests: ADEQUATE
Special Requests: ADEQUATE

## 2020-04-10 LAB — C-REACTIVE PROTEIN: CRP: 0.9 mg/dL (ref <1.0)

## 2020-04-10 LAB — D-DIMER, QUANTITATIVE: D-Dimer, Quant: 0.95 ug/mL-FEU — ABNORMAL HIGH (ref 0.00–0.50)

## 2020-04-10 LAB — FERRITIN: Ferritin: 218 ng/mL (ref 11–307)

## 2020-04-10 MED ORDER — HYDROCOD POLST-CPM POLST ER 10-8 MG/5ML PO SUER
5.0000 mL | Freq: Two times a day (BID) | ORAL | Status: DC
  Administered 2020-04-11 – 2020-04-13 (×5): 5 mL via ORAL
  Filled 2020-04-10 (×6): qty 5

## 2020-04-10 NOTE — Progress Notes (Signed)
PROGRESS NOTE    [name]  XMI:[id number] DOB: 1966-04-01 DOA: 04/05/2020 PCP: Clayborn Heron, MD    Brief Narrative:  Patient admitted to the hospital with working diagnosis of acute hypoxic respiratory failure due to SARS COVID-19 viral pneumonia.  54 year old female with past medical history for anxiety, depression, hypothyroidism, obesity, SVT and attention deficit who presented with nausea, vomiting, dizziness, weakness, dyspnea and cough. Symptoms present for about 9 days. Positive COVID-19 contact at home, she has been not vaccinated. On her initial physical examination she was febrile 103.2 F, she was tachypneic 29 breaths/min, her oxygen saturation was in the mid 80s on room air. Her lungs had no wheezing but bilateral rhonchi, heart S1-S2, present rhythmic, soft abdomen, no lower extremity edema.  Chest radiograph with bilateral lower lobe interstitial infiltrates, peripherally.  Continue to have persistent dyspnea with minimal efforts, worse at night   Assessment & Plan:   Principal Problem:   Acute hypoxemic respiratory failure due to COVID-19 [facility]) Active Problems:   Depression   Seasonal allergies   Hypothyroidism   Pneumonia due to COVID-19 virus   SVT (supraventricular tachycardia) (HCC)   1.Acute hypoxic respiratory failure due to SARS COVID-19 viral pneumonia. remdesivir #5/5   RR: 22  Pulse oxymetry: 84%  Fi02: 6 L/ HFNC  COVID-19 Labs  Recent Labs    04/08/20 0548 04/09/20 0525 04/10/20 0522  DDIMER 0.67* 0.89* 0.95*  FERRITIN 346* 256 218  CRP 0.8 0.7 0.9    Lab Results  Component Value Date   SARSCOV2NAA POSITIVE (A) 04/05/2020   Patient feels fatigue and dyspnea at reset, no nausea or vomiting, ferritin and CRP stable, slowly rising D dimer, but less than 4. Slight increase in oxygen requirements up to 6 L/min per [place]. Chest film this am with persistent infiltrates, interstitial bilateral upper and lower lobes, peripherally.     Continue aggressive medical therapy with systemic steroids (methylprednisolone 40 mg Iv Q12 H) , bronchodilators and antitussive agents. Continue with airway clearing techniques and out of bed to chair. Continue to encourage lateral decubitus, patient not able to stay prone.   Continue to follow inflammatory markers.  2. HypokalemiaPoor oral intake, renal function continue to be stable.   3. Hx SVT.On metoprolol for rate control.   4. Anxiety and depression.Continue with bupropion and citalopram.   5. Hypothyroid.On levothyroxine.   Status is: Inpatient  Remains inpatient appropriate because:IV treatments appropriate due to intensity of illness or inability to take PO   Dispo:  Patient From: Home  Planned Disposition: Home with Health Care Svc  Expected discharge date:   Medically stable for discharge: No    DVT prophylaxis: Enoxaparin   Code Status:   full  Family Communication:  No family at the bedside (husband hospitalized with COVID pneumonia)     Nutrition Status: Nutrition Problem: Increased nutrient needs Etiology: acute illness, catabolic illness (COVID-19 infection) Signs/Symptoms: estimated needs Interventions: Ensure Enlive (each supplement provides 350kcal and 20 grams of protein), Other (Comment) (Prosource Plus)        Subjective: Patient not feeling well today, very weak and fatigue, no nausea or vomiting, no chest pain and persistent dyspnea.   Objective: Vitals:   04/09/20 2248 04/09/20 2337 04/10/20 0353 04/10/20 0830  BP:  (!) 164/80 (!) 144/77 132/82  Pulse:  64 67 76  Resp: (!) 25 19 (!) 22 20  Temp:  98.7 F (37.1 C) 98.6 F (37 C) 99 F (37.2 C)  TempSrc:    Oral  SpO2: (!) 89% (!) 87% (!) 84% (!) 84%  Weight:      Height:        Intake/Output Summary (Last 24 hours) at 04/10/2020 0914 Last data filed at 04/09/2020 1800 Gross per 24 hour  Intake 700 ml  Output --  Net 700 ml   Filed Weights   04/05/20 1413   Weight: 81.6 kg    Examination:   General: Not in pain. deconditioned  Neurology: Awake and alert, non focal  E ENT: no pallor, no icterus, oral mucosa moist Cardiovascular: No JVD. S1-S2 present, rhythmic, no gallops, rubs, or murmurs. No lower extremity edema. Pulmonary: positive breath sounds bilaterally, no wheezing Gastrointestinal. Abdomen flat, no organomegaly, non tender, no rebound or guarding Skin. No rashes Musculoskeletal: no joint deformities     Data Reviewed: I have personally reviewed following labs and imaging studies  CBC: Recent Labs  Lab 04/05/20 1421 04/06/20 0434 04/07/20 0527  WBC 2.6* 1.8* 4.9  NEUTROABS 2.1 1.3* 4.0  HGB 13.2 12.1 12.5  HCT 38.3 36.6 37.5  MCV 82.4 84.9 85.2  PLT 193 162 182   Basic Metabolic Panel: Recent Labs  Lab 04/05/20 1421 04/05/20 1421 04/06/20 0434 04/07/20 0527 04/08/20 0548 04/09/20 0525 04/10/20 0522  NA 135   < > 138 136 138 141 139  K 3.1*   < > 4.3 4.4 4.0 3.7 3.7  CL 97*   < > 103 103 101 103 101  CO2 24   < > 23 22 25 26 27   GLUCOSE 112*   < > 132* 152* 149* 146* 151*  BUN 15   < > 17 23* 17 23* 18  CREATININE 0.66   < > 0.65 0.59 0.57 0.50 0.50  CALCIUM 8.2*   < > 8.6* 8.1* 8.7* 8.4* 8.5*  MG 2.1  --   --   --   --   --   --    < > = values in this interval not displayed.   GFR: Estimated Creatinine Clearance: 77.8 mL/min (by C-G formula based on SCr of 0.5 mg/dL). Liver Function Tests: Recent Labs  Lab 04/06/20 0434 04/07/20 0527 04/08/20 0548 04/09/20 0525 04/10/20 0522  AST 34 30 29 21 22   ALT 21 22 23 23 27   ALKPHOS 51 47 56 53 51  BILITOT 0.5 0.4 0.5 0.3 0.3  PROT 6.8 6.5 6.3* 5.9* 5.9*  ALBUMIN 3.2* 3.0* 3.0* 2.9* 3.0*   No results for input(s): LIPASE, AMYLASE in the last 168 hours. No results for input(s): AMMONIA in the last 168 hours. Coagulation Profile: No results for input(s): INR, PROTIME in the last 168 hours. Cardiac Enzymes: No results for input(s): CKTOTAL, CKMB,  CKMBINDEX, TROPONINI in the last 168 hours. BNP (last 3 results) No results for input(s): PROBNP in the last 8760 hours. HbA1C: No results for input(s): HGBA1C in the last 72 hours. CBG: No results for input(s): GLUCAP in the last 168 hours. Lipid Profile: No results for input(s): CHOL, HDL, LDLCALC, TRIG, CHOLHDL, LDLDIRECT in the last 72 hours. Thyroid Function Tests: No results for input(s): TSH, T4TOTAL, FREET4, T3FREE, THYROIDAB in the last 72 hours. Anemia Panel: Recent Labs    04/09/20 0525 04/10/20 0522  FERRITIN 256 218      Radiology Studies: I have reviewed all of the imaging during this hospital visit personally     Scheduled Meds: . (feeding supplement) PROSource Plus  30 mL Oral Daily  . vitamin C  500 mg Oral Daily  .  buPROPion  150 mg Oral Daily  . cholecalciferol  2,000 Units Oral QHS  . citalopram  40 mg Oral Daily  . cyproheptadine  2 mg Oral QHS  . enoxaparin (LOVENOX) injection  40 mg Subcutaneous QHS  . feeding supplement (ENSURE ENLIVE)  237 mL Oral BID BM  . Ipratropium-Albuterol  1 puff Inhalation TID  . levothyroxine  75 mcg Oral Daily  . methylPREDNISolone (SOLU-MEDROL) injection  40 mg Intravenous Q12H  . metoprolol tartrate  12.5 mg Oral QHS  . montelukast  10 mg Oral QHS  . pantoprazole  40 mg Oral Daily  . zinc sulfate  220 mg Oral Daily   Continuous Infusions:   LOS: 5 days        Jaunice Mirza [name] Gula, MD

## 2020-04-11 LAB — C-REACTIVE PROTEIN: CRP: 1 mg/dL — ABNORMAL HIGH (ref <1.0)

## 2020-04-11 LAB — COMPREHENSIVE METABOLIC PANEL
ALT: 26 U/L (ref 0–44)
AST: 17 U/L (ref 15–41)
Albumin: 2.9 g/dL — ABNORMAL LOW (ref 3.5–5.0)
Alkaline Phosphatase: 52 U/L (ref 38–126)
Anion gap: 9 (ref 5–15)
BUN: 19 mg/dL (ref 6–20)
CO2: 30 mmol/L (ref 22–32)
Calcium: 8.4 mg/dL — ABNORMAL LOW (ref 8.9–10.3)
Chloride: 101 mmol/L (ref 98–111)
Creatinine, Ser: 0.49 mg/dL (ref 0.44–1.00)
GFR calc Af Amer: >60 mL/min (ref >60)
GFR calc non Af Amer: >60 mL/min (ref >60)
Glucose, Bld: 136 mg/dL — ABNORMAL HIGH (ref 70–99)
Potassium: 4 mmol/L (ref 3.5–5.1)
Sodium: 140 mmol/L (ref 135–145)
Total Bilirubin: 0.6 mg/dL (ref 0.3–1.2)
Total Protein: 5.9 g/dL — ABNORMAL LOW (ref 6.5–8.1)

## 2020-04-11 LAB — FERRITIN: Ferritin: 178 ng/mL (ref 11–307)

## 2020-04-11 LAB — D-DIMER, QUANTITATIVE: D-Dimer, Quant: 0.68 ug/mL-FEU — ABNORMAL HIGH (ref 0.00–0.50)

## 2020-04-11 MED ORDER — METHYLPREDNISOLONE SODIUM SUCC 125 MG IJ SOLR
40.0000 mg | Freq: Every day | INTRAMUSCULAR | Status: DC
  Administered 2020-04-12 – 2020-04-13 (×2): 40 mg via INTRAVENOUS
  Filled 2020-04-11 (×2): qty 2

## 2020-04-11 NOTE — Progress Notes (Signed)
Wife concerned about her husband who is on HFNC in ICU, agreed to bring her down to his room to visit with him and encourage him.  She remained on O2 and appropriate monitoring, and I remained with her for visit and will transport back.

## 2020-04-11 NOTE — Progress Notes (Signed)
PROGRESS NOTE    [name]  ZOX:[id number] DOB: May 05, 1966 DOA: 04/05/2020 PCP: Clayborn Heron, MD    Brief Narrative:  Patient admitted to the hospital with working diagnosis of acute hypoxic respiratory failure due to SARS COVID-19 viral pneumonia.  54 year old female with past medical history for anxiety, depression, hypothyroidism, obesity, SVT and attention deficit who presented with nausea, vomiting, dizziness, weakness, dyspnea and cough. Symptoms present for about 9 days. Positive COVID-19 contact at home, she has been not vaccinated. On her initial physical examination she was febrile 103.2 F, she was tachypneic 29 breaths/min, her oxygen saturation was in the mid 80s on room air. Her lungs had no wheezing but bilateral rhonchi, heart S1-S2, present rhythmic, soft abdomen, no lower extremity edema.  Chest radiograph with bilateral lower lobe interstitial infiltrates, peripherally.  Continue to have persistent dyspnea with minimal efforts, worse at night.    Assessment & Plan:   Principal Problem:   Acute hypoxemic respiratory failure due to COVID-19 [facility]) Active Problems:   Depression   Seasonal allergies   Hypothyroidism   Pneumonia due to COVID-19 virus   SVT (supraventricular tachycardia) (HCC)   1.Acute hypoxic respiratory failure due to SARS COVID-19 viral pneumonia. Sp Remdesivir #5/5.   RR: 21  Pulse oxymetry: 87% to 92%  Fi02: 6 L/ min HFNC  COVID-19 Labs  Recent Labs    04/09/20 0525 04/10/20 0522 04/11/20 0536  DDIMER 0.89* 0.95* 0.68*  FERRITIN 256 218 178  CRP 0.7 0.9 1.0*    Lab Results  Component Value Date   SARSCOV2NAA POSITIVE (A) 04/05/2020    Patient is feeling better this am, with more energy, inflammatory markers are trending down.   Medical therapy with bronchodilators and antitussive agents.Decrease dose of systemic steroids to methylprednisolone 40 mg daily. Continue with airway clearing techniques and getting  out of bed to chair tid with meal. Not able to stay prone.   Continue follow inflammatory markers, target oxygen saturation more than 88%.   2. HypokalemiaCorrected hypokalemia, with serum K at 4.0, Renal function stable with serum cr at 0,49 with bicarbonate at 30. Patient is tolerating po well with no nausea or vomiting.   3. Hx SVT.On metoprolol.   4. Anxiety and depression.continue with bupropion and citalopram.   5. Hypothyroid.On levothyroxine.   Status is: Inpatient  Remains inpatient appropriate because:IV treatments appropriate due to intensity of illness or inability to take PO   Dispo:  Patient From: Home  Planned Disposition: Home with Health Care Svc  Expected discharge date: 04/13/20  Medically stable for discharge: No    DVT prophylaxis: Enoxaparin   Code Status:   full  Family Communication:  No family at the bedside      Nutrition Status: Nutrition Problem: Increased nutrient needs Etiology: acute illness, catabolic illness (COVID-19 infection) Signs/Symptoms: estimated needs Interventions: Ensure Enlive (each supplement provides 350kcal and 20 grams of protein), Other (Comment) (Prosource Plus)        Subjective: Patient is feeling better today, with more energy, but not yet back to baseline, no nausea or vomiting, no chest pain.   Objective: Vitals:   04/10/20 1241 04/10/20 1600 04/10/20 1950 04/11/20 0525  BP: 140/74  (!) 147/75 138/75  Pulse: 73  76 61  Resp: 18  (!) 22 (!) 21  Temp: 98.9 F (37.2 C)  98.7 F (37.1 C) 98 F (36.7 C)  TempSrc: Oral     SpO2: (!) 86% (!) 89% 92% (!) 87%  Weight:  Height:        Intake/Output Summary (Last 24 hours) at 04/11/2020 1018 Last data filed at 04/10/2020 1235 Gross per 24 hour  Intake 240 ml  Output --  Net 240 ml   Filed Weights   04/05/20 1413  Weight: 81.6 kg    Examination:   General: Not in pain or dyspnea deconditioned  Neurology: Awake and alert, non focal   E ENT: no pallor, no icterus, oral mucosa moist Cardiovascular: No JVD. S1-S2 present, rhythmic, no gallops, rubs, or murmurs. No lower extremity edema. Pulmonary: positive breath sounds bilaterally, with no wheezing. Gastrointestinal. Abdomen soft and non tender Skin. No rashes Musculoskeletal: no joint deformities     Data Reviewed: I have personally reviewed following labs and imaging studies  CBC: Recent Labs  Lab 04/05/20 1421 04/06/20 0434 04/07/20 0527  WBC 2.6* 1.8* 4.9  NEUTROABS 2.1 1.3* 4.0  HGB 13.2 12.1 12.5  HCT 38.3 36.6 37.5  MCV 82.4 84.9 85.2  PLT 193 162 182   Basic Metabolic Panel: Recent Labs  Lab 04/05/20 1421 04/06/20 0434 04/07/20 0527 04/08/20 0548 04/09/20 0525 04/10/20 0522 04/11/20 0536  NA 135   < > 136 138 141 139 140  K 3.1*   < > 4.4 4.0 3.7 3.7 4.0  CL 97*   < > 103 101 103 101 101  CO2 24   < > 22 25 26 27 30   GLUCOSE 112*   < > 152* 149* 146* 151* 136*  BUN 15   < > 23* 17 23* 18 19  CREATININE 0.66   < > 0.59 0.57 0.50 0.50 0.49  CALCIUM 8.2*   < > 8.1* 8.7* 8.4* 8.5* 8.4*  MG 2.1  --   --   --   --   --   --    < > = values in this interval not displayed.   GFR: Estimated Creatinine Clearance: 77.8 mL/min (by C-G formula based on SCr of 0.49 mg/dL). Liver Function Tests: Recent Labs  Lab 04/07/20 0527 04/08/20 0548 04/09/20 0525 04/10/20 0522 04/11/20 0536  AST 30 29 21 22 17   ALT 22 23 23 27 26   ALKPHOS 47 56 53 51 52  BILITOT 0.4 0.5 0.3 0.3 0.6  PROT 6.5 6.3* 5.9* 5.9* 5.9*  ALBUMIN 3.0* 3.0* 2.9* 3.0* 2.9*   No results for input(s): LIPASE, AMYLASE in the last 168 hours. No results for input(s): AMMONIA in the last 168 hours. Coagulation Profile: No results for input(s): INR, PROTIME in the last 168 hours. Cardiac Enzymes: No results for input(s): CKTOTAL, CKMB, CKMBINDEX, TROPONINI in the last 168 hours. BNP (last 3 results) No results for input(s): PROBNP in the last 8760 hours. HbA1C: No results for  input(s): HGBA1C in the last 72 hours. CBG: No results for input(s): GLUCAP in the last 168 hours. Lipid Profile: No results for input(s): CHOL, HDL, LDLCALC, TRIG, CHOLHDL, LDLDIRECT in the last 72 hours. Thyroid Function Tests: No results for input(s): TSH, T4TOTAL, FREET4, T3FREE, THYROIDAB in the last 72 hours. Anemia Panel: Recent Labs    04/10/20 0522 04/11/20 0536  FERRITIN 218 178      Radiology Studies: I have reviewed all of the imaging during this hospital visit personally     Scheduled Meds: . (feeding supplement) PROSource Plus  30 mL Oral Daily  . vitamin C  500 mg Oral Daily  . buPROPion  150 mg Oral Daily  . chlorpheniramine-HYDROcodone  5 mL Oral Q12H  . cholecalciferol  2,000 Units Oral QHS  . citalopram  40 mg Oral Daily  . cyproheptadine  2 mg Oral QHS  . enoxaparin (LOVENOX) injection  40 mg Subcutaneous QHS  . feeding supplement (ENSURE ENLIVE)  237 mL Oral BID BM  . Ipratropium-Albuterol  1 puff Inhalation TID  . levothyroxine  75 mcg Oral Daily  . methylPREDNISolone (SOLU-MEDROL) injection  40 mg Intravenous Q12H  . metoprolol tartrate  12.5 mg Oral QHS  . montelukast  10 mg Oral QHS  . pantoprazole  40 mg Oral Daily  . zinc sulfate  220 mg Oral Daily   Continuous Infusions:   LOS: 6 days        Deissy Guilbert [name] Gula, MD

## 2020-04-12 LAB — COMPREHENSIVE METABOLIC PANEL
ALT: 23 U/L (ref 0–44)
AST: 14 U/L — ABNORMAL LOW (ref 15–41)
Albumin: 2.7 g/dL — ABNORMAL LOW (ref 3.5–5.0)
Alkaline Phosphatase: 47 U/L (ref 38–126)
Anion gap: 8 (ref 5–15)
BUN: 16 mg/dL (ref 6–20)
CO2: 30 mmol/L (ref 22–32)
Calcium: 8.1 mg/dL — ABNORMAL LOW (ref 8.9–10.3)
Chloride: 102 mmol/L (ref 98–111)
Creatinine, Ser: 0.58 mg/dL (ref 0.44–1.00)
GFR calc Af Amer: >60 mL/min (ref >60)
GFR calc non Af Amer: >60 mL/min (ref >60)
Glucose, Bld: 87 mg/dL (ref 70–99)
Potassium: 3.7 mmol/L (ref 3.5–5.1)
Sodium: 140 mmol/L (ref 135–145)
Total Bilirubin: 0.6 mg/dL (ref 0.3–1.2)
Total Protein: 5.3 g/dL — ABNORMAL LOW (ref 6.5–8.1)

## 2020-04-12 LAB — D-DIMER, QUANTITATIVE: D-Dimer, Quant: 0.63 ug/mL-FEU — ABNORMAL HIGH (ref 0.00–0.50)

## 2020-04-12 LAB — C-REACTIVE PROTEIN: CRP: 0.5 mg/dL (ref <1.0)

## 2020-04-12 LAB — FERRITIN: Ferritin: 136 ng/mL (ref 11–307)

## 2020-04-12 NOTE — Progress Notes (Addendum)
PROGRESS NOTE    [name]  ZJQ:[id number] DOB: [birth year] DOA: 04/05/2020 PCP: Clayborn Heron, MD    Brief Narrative:  Patient admitted to the hospital with working diagnosis of acute hypoxic respiratory failure due to SARS COVID-19 viral pneumonia.  54 year old female with past medical history for anxiety, depression, hypothyroidism, obesity, SVT and attention deficit who presented with nausea, vomiting, dizziness, weakness, dyspnea and cough. Symptoms present for about 9 days. Positive COVID-19 contact at home, she has been not vaccinated. On her initial physical examination she was febrile 103.2 F, she was tachypneic 29 breaths/min, her oxygen saturation was in the mid 80s on room air. Her lungs had no wheezing but bilateral rhonchi, heart S1-S2, present rhythmic, soft abdomen, no lower extremity edema.  Chest radiograph with bilateral lower lobe interstitial infiltrates, peripherally.  Continue to have persistent dyspnea with minimal efforts, worse at night.  Oxygen requirements have been sable at 6 L per min, inflammatory markers are trending down.    Assessment & Plan:   Principal Problem:   Acute hypoxemic respiratory failure due to COVID-19 [facility]) Active Problems:   Depression   Seasonal allergies   Hypothyroidism   Pneumonia due to COVID-19 virus   SVT (supraventricular tachycardia) (HCC)  Sepsis ruled out, patient's symptoms due to viral pneumonia.    1.Acute hypoxic respiratory failure due to SARS COVID-19 viral pneumonia. Sp Remdesivir #5/5.   RR: 18  Pulse oxymetry: 90 to 91%  Fi02: 6 L/min per HFNC  COVID-19 Labs  Recent Labs    04/10/20 0522 04/11/20 0536 04/12/20 0439  DDIMER 0.95* 0.68* 0.63*  FERRITIN 218 178 136  CRP 0.9 1.0* 0.5    Lab Results  Component Value Date   SARSCOV2NAA POSITIVE (A) 04/05/2020    Inflammatory markers are improving, patient continue to have generalized weakness and decreased energy. Dyspnea with  exertion.   Decrease supplemental 02 to 5 L per min, keep oxygen saturation more than 88%. Continue medical therapy with systemic steroids, bronchodilators and antitussive agents.  Out of bed to chair tid with meals, and follow with physical and occupational therapy recommendations.   2. Hypokalemia Electrolytes and renal function stable, with serum K at 3,7 and cr at 0,58 with bicarbonate at 30. Patient tolerating po well.   3. Hx SVT.Continue with metoprolol with good toleration.   4. Anxiety and depression.On bupropion and citalopram.  5. Hypothyroid. Continue with  levothyroxine   Status is: Inpatient  Remains inpatient appropriate because:Inpatient level of care appropriate due to severity of illness   Dispo:  Patient From: Home  Planned Disposition: Home with Health Care Svc  Expected discharge date: 04/13/20  Medically stable for discharge: No    DVT prophylaxis: Enoxaparin   Code Status:   full  Family Communication:  Her husband is hospitalized with COVID 19     Nutrition Status: Nutrition Problem: Increased nutrient needs Etiology: acute illness, catabolic illness (COVID-19 infection) Signs/Symptoms: estimated needs Interventions: Ensure Enlive (each supplement provides 350kcal and 20 grams of protein), Other (Comment) (Prosource Plus)     Subjective: Patient continue to feel very weak and deconditioned, no nausea or vomiting, continue to have dyspnea on exertion.   Objective: Vitals:   04/11/20 1331 04/11/20 1955 04/11/20 2209 04/12/20 0608  BP: 132/74  127/71 130/71  Pulse: 76  70 65  Resp: 18  18 18   Temp: 98.2 F (36.8 C)  98.9 F (37.2 C) 98.6 F (37 C)  TempSrc: Oral  Oral Oral  SpO2: 93% 91%  91% 90%  Weight:      Height:        Intake/Output Summary (Last 24 hours) at 04/12/2020 0958 Last data filed at 04/12/2020 0600 Gross per 24 hour  Intake 240 ml  Output --  Net 240 ml   Filed Weights   04/05/20 1413  Weight: 81.6 kg     Examination:   General: Not in pain or dyspnea. Deconditioned  Neurology: Awake and alert, non focal  E ENT: no pallor, no icterus, oral mucosa moist Cardiovascular: No JVD. S1-S2 present, rhythmic, no gallops, rubs, or murmurs. No lower extremity edema. Pulmonary: postiive breath sounds bilaterally, adequate air movement, no wheezing, rhonchi or rales. Gastrointestinal. Abdomen soft and non tender Skin. No rashes Musculoskeletal: no joint deformities     Data Reviewed: I have personally reviewed following labs and imaging studies  CBC: Recent Labs  Lab 04/05/20 1421 04/06/20 0434 04/07/20 0527  WBC 2.6* 1.8* 4.9  NEUTROABS 2.1 1.3* 4.0  HGB 13.2 12.1 12.5  HCT 38.3 36.6 37.5  MCV 82.4 84.9 85.2  PLT 193 162 182   Basic Metabolic Panel: Recent Labs  Lab 04/05/20 1421 04/06/20 0434 04/08/20 0548 04/09/20 0525 04/10/20 0522 04/11/20 0536 04/12/20 0439  NA 135   < > 138 141 139 140 140  K 3.1*   < > 4.0 3.7 3.7 4.0 3.7  CL 97*   < > 101 103 101 101 102  CO2 24   < > 25 26 27 30 30   GLUCOSE 112*   < > 149* 146* 151* 136* 87  BUN 15   < > 17 23* 18 19 16   CREATININE 0.66   < > 0.57 0.50 0.50 0.49 0.58  CALCIUM 8.2*   < > 8.7* 8.4* 8.5* 8.4* 8.1*  MG 2.1  --   --   --   --   --   --    < > = values in this interval not displayed.   GFR: Estimated Creatinine Clearance: 77.8 mL/min (by C-G formula based on SCr of 0.58 mg/dL). Liver Function Tests: Recent Labs  Lab 04/08/20 0548 04/09/20 0525 04/10/20 0522 04/11/20 0536 04/12/20 0439  AST 29 21 22 17  14*  ALT 23 23 27 26 23   ALKPHOS 56 53 51 52 47  BILITOT 0.5 0.3 0.3 0.6 0.6  PROT 6.3* 5.9* 5.9* 5.9* 5.3*  ALBUMIN 3.0* 2.9* 3.0* 2.9* 2.7*   No results for input(s): LIPASE, AMYLASE in the last 168 hours. No results for input(s): AMMONIA in the last 168 hours. Coagulation Profile: No results for input(s): INR, PROTIME in the last 168 hours. Cardiac Enzymes: No results for input(s): CKTOTAL, CKMB,  CKMBINDEX, TROPONINI in the last 168 hours. BNP (last 3 results) No results for input(s): PROBNP in the last 8760 hours. HbA1C: No results for input(s): HGBA1C in the last 72 hours. CBG: No results for input(s): GLUCAP in the last 168 hours. Lipid Profile: No results for input(s): CHOL, HDL, LDLCALC, TRIG, CHOLHDL, LDLDIRECT in the last 72 hours. Thyroid Function Tests: No results for input(s): TSH, T4TOTAL, FREET4, T3FREE, THYROIDAB in the last 72 hours. Anemia Panel: Recent Labs    04/11/20 0536 04/12/20 0439  FERRITIN 178 136      Radiology Studies: I have reviewed all of the imaging during this hospital visit personally     Scheduled Meds: . (feeding supplement) PROSource Plus  30 mL Oral Daily  . vitamin C  500 mg Oral Daily  . buPROPion  150  mg Oral Daily  . chlorpheniramine-HYDROcodone  5 mL Oral Q12H  . cholecalciferol  2,000 Units Oral QHS  . citalopram  40 mg Oral Daily  . cyproheptadine  2 mg Oral QHS  . enoxaparin (LOVENOX) injection  40 mg Subcutaneous QHS  . feeding supplement (ENSURE ENLIVE)  237 mL Oral BID BM  . Ipratropium-Albuterol  1 puff Inhalation TID  . levothyroxine  75 mcg Oral Daily  . methylPREDNISolone (SOLU-MEDROL) injection  40 mg Intravenous Daily  . metoprolol tartrate  12.5 mg Oral QHS  . montelukast  10 mg Oral QHS  . pantoprazole  40 mg Oral Daily  . zinc sulfate  220 mg Oral Daily   Continuous Infusions:   LOS: 7 days        Monica Bray [name] Gula, MD

## 2020-04-13 LAB — COMPREHENSIVE METABOLIC PANEL
ALT: 21 U/L (ref 0–44)
AST: 13 U/L — ABNORMAL LOW (ref 15–41)
Albumin: 2.7 g/dL — ABNORMAL LOW (ref 3.5–5.0)
Alkaline Phosphatase: 51 U/L (ref 38–126)
Anion gap: 9 (ref 5–15)
BUN: 18 mg/dL (ref 6–20)
CO2: 29 mmol/L (ref 22–32)
Calcium: 8.1 mg/dL — ABNORMAL LOW (ref 8.9–10.3)
Chloride: 102 mmol/L (ref 98–111)
Creatinine, Ser: 0.57 mg/dL (ref 0.44–1.00)
GFR calc Af Amer: >60 mL/min (ref >60)
GFR calc non Af Amer: >60 mL/min (ref >60)
Glucose, Bld: 99 mg/dL (ref 70–99)
Potassium: 3.4 mmol/L — ABNORMAL LOW (ref 3.5–5.1)
Sodium: 140 mmol/L (ref 135–145)
Total Bilirubin: 0.6 mg/dL (ref 0.3–1.2)
Total Protein: 5.4 g/dL — ABNORMAL LOW (ref 6.5–8.1)

## 2020-04-13 LAB — C-REACTIVE PROTEIN: CRP: 4.5 mg/dL — ABNORMAL HIGH

## 2020-04-13 LAB — D-DIMER, QUANTITATIVE: D-Dimer, Quant: 0.34 ug{FEU}/mL (ref 0.00–0.50)

## 2020-04-13 LAB — FERRITIN: Ferritin: 141 ng/mL (ref 11–307)

## 2020-04-13 MED ORDER — PREDNISONE 20 MG PO TABS: 20.0000 mg | ORAL_TABLET | Freq: Every day | ORAL | 0 refills | Status: AC

## 2020-04-13 MED ORDER — ALBUTEROL SULFATE HFA 108 (90 BASE) MCG/ACT IN AERS: 1.0000 | INHALATION_SPRAY | RESPIRATORY_TRACT | 0 refills | Status: AC | PRN

## 2020-04-13 MED ORDER — PREDNISONE 20 MG PO TABS: 20.0000 mg | ORAL_TABLET | Freq: Every day | ORAL | Status: DC

## 2020-04-13 MED ORDER — ACETAMINOPHEN 325 MG PO TABS: 650.0000 mg | ORAL_TABLET | Freq: Four times a day (QID) | ORAL | Status: DC | PRN

## 2020-04-13 MED ORDER — GUAIFENESIN-DM 100-10 MG/5ML PO SYRP: 5.0000 mL | ORAL_SOLUTION | Freq: Four times a day (QID) | ORAL | 0 refills | Status: DC | PRN

## 2020-04-13 MED ORDER — ENSURE ENLIVE PO LIQD: 237.0000 mL | Freq: Two times a day (BID) | ORAL | 0 refills | Status: AC

## 2020-04-13 NOTE — Progress Notes (Signed)
SATURATION QUALIFICATIONS: (This note is used to comply with regulatory documentation for home oxygen)  Patient Saturations on Room Air at Rest = 86%  Patient Saturations on Room Air while Ambulating = 82%  Patient Saturations on 4 Liters of oxygen while Ambulating = 86%  Please briefly explain why patient needs home oxygen: to maintain appropriate SaO2 levels   [name] PT 04/13/2020  Acute Rehabilitation Services Pager [phone] Office [phone]

## 2020-04-13 NOTE — Progress Notes (Signed)
Occupational Therapy Evaluation  Patient with functional deficits listed below impacting safety and independence with self care. Patient desat to low 70s on 4L o2 with functional ambulation in hallway, increased to 6L for remaining 63ft with recover to lower 80s. Once back to high 80s weaned to 5L O2 for seated exercise with theraband and min cues for technique. Recommend continued acute OT services to maximize patient activity tolerance and further education regarding breathing strategies + energy conservation to facilitate D/C to venue listed below.    04/13/20 1248  OT Visit Information  Last OT Received On 04/13/20  Assistance Needed +1  History of Present Illness Patient is a 54 year old female past medical history for anxiety, depression, hypothyroidism, obesity, SVT and attention deficit who presented with nausea, vomiting, dizziness, weakness, dyspnea and cough. Patient diagnosed with COVID.  Precautions  Precautions None  Precaution Comments monitor sats  Home Living  Family/patient expects to be discharged to: Private residence  Living Arrangements Spouse/significant other;Children;Other relatives;Other (Comment) (son and mother in law)  Available Help at Discharge Family  Type of Home House  Home Access Stairs to enter  Entrance Stairs-Number of Steps 10  Entrance Stairs-Rails None  Home Layout Two level;Bed/bath upstairs  Alternate Level Stairs-Number of Steps 14  Alternate Level Stairs-Rails  (have stair lift for MIL)  Bathroom Shower/Tub Walk-in Forensic psychologist;Other (comment) (mother in law's bathroom has grab bars )  Additional Comments has pulse oximeter at home  Prior Function  Level of Independence Independent  Communication  Communication No difficulties  Pain Assessment  Pain Assessment Faces  Faces Pain Scale 4  Pain Location L hip/lower back  Pain Descriptors / Indicators Cramping  Pain Intervention(s) Heat  applied  Cognition  Arousal/Alertness Awake/alert  Behavior During Therapy WFL for tasks assessed/performed  Overall Cognitive Status Within Functional Limits for tasks assessed  Upper Extremity Assessment  Upper Extremity Assessment Generalized weakness  Lower Extremity Assessment  Lower Extremity Assessment Defer to PT evaluation  ADL  Overall ADL's  Modified independent  General ADL Comments patient has been transferring to bedside commode without physical assistance   Bed Mobility  Overal bed mobility Modified Independent  Transfers  Overall transfer level Modified independent  Balance  Overall balance assessment Mild deficits observed, not formally tested  General Comments  General comments (skin integrity, edema, etc.) patient desaturate to low 70s on 4L with functional ambulation in hallway with cues for pursed lip breathing, patient increased to 6L for remaining 55ft of ambulation and returned to bed. able to wean back to 5L O2 for seated exercises with O2 maintaining mid to high 80s  Exercises  Exercises Other exercises  Other Exercises  Other Exercises elbow flexion + extension, shoulder diagonal pulls, horizontal abduction, shoulder retraction, and shoulder flexion x5 reps each exercise with level 1 theraband   OT - End of Session  Equipment Utilized During Treatment Other (comment);Oxygen  Activity Tolerance Patient tolerated treatment well  Patient left in bed;with call bell/phone within reach  Nurse Communication Mobility status  OT Assessment  OT Recommendation/Assessment Patient needs continued OT Services  OT Visit Diagnosis Other abnormalities of gait and mobility (R26.89);Muscle weakness (generalized) (M62.81)  OT Problem List Decreased activity tolerance;Cardiopulmonary status limiting activity;Decreased strength  OT Plan  OT Frequency (ACUTE ONLY) Min 2X/week  OT Treatment/Interventions (ACUTE ONLY) Therapeutic exercise;Patient/family education;Therapeutic  activities;DME and/or AE instruction;Energy conservation  AM-PAC OT "6 Clicks" Daily Activity Outcome Measure (Version 2)  Help from  another person eating meals? 4  Help from another person taking care of personal grooming? 4  Help from another person toileting, which includes using toliet, bedpan, or urinal? 4  Help from another person bathing (including washing, rinsing, drying)? 4  Help from another person to put on and taking off regular upper body clothing? 4  Help from another person to put on and taking off regular lower body clothing? 4  6 Click Score 24  OT Recommendation  Follow Up Recommendations No OT follow up  OT Equipment None recommended by OT  Individuals Consulted  Consulted and Agree with Results and Recommendations Patient  Acute Rehab OT Goals  Patient Stated Goal go home  OT Goal Formulation With patient  Time For Goal Achievement 04/27/20  Potential to Achieve Goals Good  OT Time Calculation  OT Start Time (ACUTE ONLY) 0925  OT Stop Time (ACUTE ONLY) 0955  OT Time Calculation (min) 30 min  OT General Charges  $OT Visit 1 Visit  OT Evaluation  $OT Eval Low Complexity 1 Low  OT Treatments  $Therapeutic Exercise 8-22 mins  Written Expression  Dominant Hand Left   Marlyce Huge OT OT pager: [phone]

## 2020-04-13 NOTE — Discharge Summary (Signed)
Physician Discharge Summary  [name] Pfannenstiel RUE:[id number] DOB: 06/16/1966 DOA: 04/05/2020  PCP: Clayborn Heronankins, Victoria R, MD  Admit date: 04/05/2020 Discharge date: 04/13/2020  Admitted From: Home  Disposition:  Home   Recommendations for Outpatient Follow-up and new medication changes:  1. Follow up with Dr. Barbaraann Barthelankins in 7 days.  2. Continue bronchodilators and antitussive agents at home. 3. Continue prednisone 20 mg daily for 5 more days.  4. Continue self quarantine for 2 more weeks, use a mask in public and maintain physical distancing. It is recommended to take COVID 19 vaccine when recovered per primary care.    Home Health: no   Equipment/Devices: home 02    Discharge Condition: stable  CODE STATUS: full  Diet recommendation: heart healthy   Brief/Interim Summary: Patient admitted to the hospital with working diagnosis of acute hypoxic respiratory failure due to SARS COVID-19 viral pneumonia.  54 year old female with past medical history for anxiety, depression, hypothyroidism, obesity, SVT and attention deficit who presented with nausea, vomiting, dizziness, weakness, dyspnea and cough. Symptoms present for about 9 days prior to hospitalization. Positive COVID-19 contact at home, she has been not vaccinated. On her initial physical examination she was febrile 103.2 F, she was tachypneic 29 breaths/min, her oxygen saturation was in the mid 80s on room air. Her lungs had no wheezing but bilateral rhonchi, heart S1-S2, present rhythmic, soft abdomen, no lower extremity edema. Sodium 135, potassium 3.1, chloride 97, bicarb 24, glucose 112, BUN 15, creatinine 0.66, magnesium 2.1, white count 2.6, hemoglobin 13.2, hematocrit 38.3, platelets 193.  SARS COVID-19 positive. Chest radiograph with bilateral lower lobe interstitial infiltrates, peripherally. EKG 87 bpm, normal axis, normal intervals, sinus rhythm, poor R wave progression, no ST segment changes, precordial T wave  inversions.  Patient placed on medical therapy with systemic steroids and remdesivir with good toleration.   She continue to have persistent dyspnea with minimal efforts. Slowly her symptoms have been improving.  Patient will need supplemental home oxygen at discharge.  1.  Acute hypoxic respiratory failure due to SARS COVID-19 viral pneumonia. Patient was admitted to the medical ward, she received submental oxygen per nasal cough, aggressive medical therapy with systemic corticosteroids and remdesivir.  She was treated with bronchodilators, antitussive agents and airway clearing techniques.  Her symptoms slowly improve along with her oxygenation and inflammatory markers.  She continued to have dyspnea on exertion along with oxygen desaturation.  Patient will be discharged home with submental oxygen.    COVID-19 Labs  Recent Labs    04/11/20 0536 04/12/20 0439 04/13/20 0404  DDIMER 0.68* 0.63* 0.34  FERRITIN 178 136 141  CRP 1.0* 0.5 4.5*    Lab Results  Component Value Date   SARSCOV2NAA POSITIVE (A) 04/05/2020   At discharge her oxygenation is 94% on 5 L/min at rest.  She will continue taking prednisone 20 mg for 5 more days, as needed antitussive agents and bronchodilators.  2.  Hypokalemia.  Her electrolytes were corrected, her kidney function remained stable, patient at discharge tolerating p.o. diet adequately.  3.  History of SVT.  Metoprolol was continued with good toleration, heart rate remained well controlled.  4.  Anxiety and depression.  Continue bupropion and citalopram.  5.  Hypothyroidism.  Continue levothyroxine.   Discharge Diagnoses:  Principal Problem:   Acute hypoxemic respiratory failure due to COVID-19 [facility](HCC) Active Problems:   Depression   Seasonal allergies   Hypothyroidism   Pneumonia due to COVID-19 virus   SVT (supraventricular tachycardia) (HCC)  Discharge Instructions  Discharge Instructions    Diet - low sodium heart healthy    Complete by: As directed    Discharge instructions   Complete by: As directed    Please discharge after home oxygen arranged.   Increase activity slowly   Complete by: As directed      Allergies as of 04/13/2020      Reactions   Promethazine Hcl Nausea Only      Medication List    STOP taking these medications   Vitamin D (Ergocalciferol) 1.25 MG (50000 UNIT) Caps capsule Commonly known as: DRISDOL     TAKE these medications   acetaminophen 325 MG tablet Commonly known as: TYLENOL Take 2 tablets (650 mg total) by mouth every 6 (six) hours as needed for mild pain or headache (fever >/= 101).   albuterol 108 (90 Base) MCG/ACT inhaler Commonly known as: VENTOLIN HFA Inhale 1 puff into the lungs every 4 (four) hours as needed for wheezing or shortness of breath.   buPROPion 150 MG 24 hr tablet Commonly known as: WELLBUTRIN XL Take 150 mg by mouth daily.   citalopram 40 MG tablet Commonly known as: CELEXA Take 40 mg by mouth daily.   cyproheptadine 4 MG tablet Commonly known as: PERIACTIN 1/2 to 1 tablet at bedtime What changed:   how much to take  how to take this  when to take this  additional instructions   feeding supplement (ENSURE ENLIVE) Liqd Take 237 mLs by mouth 2 (two) times daily between meals.   Fish Oil 1000 MG Caps Take 1 capsule by mouth daily.   guaiFENesin-dextromethorphan 100-10 MG/5ML syrup Commonly known as: ROBITUSSIN DM Take 5 mLs by mouth every 6 (six) hours as needed for cough.   levothyroxine 75 MCG tablet Commonly known as: SYNTHROID Take 75 mcg by mouth daily.   metoprolol tartrate 25 MG tablet Commonly known as: LOPRESSOR TAKE 1 TABLET BY MOUTH EVERY MORNING AND TAKE 1/2 TABLET BY MOUTH EVERY EVENING What changed: See the new instructions.   montelukast 10 MG tablet Commonly known as: SINGULAIR Take 1 tablet (10 mg total) by mouth at bedtime.   omeprazole 40 MG capsule Commonly known as: PRILOSEC TAKE ONE CAPSULE BY  MOUTH DAILY   predniSONE 20 MG tablet Commonly known as: DELTASONE Take 1 tablet (20 mg total) by mouth daily with breakfast for 5 days. Start taking on: April 14, 2020   PROBIOTIC DAILY PO Take 1 capsule by mouth daily.   vitamin C 250 MG tablet Commonly known as: ASCORBIC ACID Take 250 mg by mouth daily.   Vitamin D 50 MCG (2000 UT) Caps Take 2,000 Units by mouth daily.   ZINC PO Take 1 tablet by mouth daily.       Allergies  Allergen Reactions  . Promethazine Hcl Nausea Only        Procedures/Studies: DG Chest 1 View  Result Date: 04/10/2020 CLINICAL DATA:  COVID-19 positivity with shortness of breath EXAM: CHEST  1 VIEW COMPARISON:  04/05/2020 FINDINGS: Cardiac shadow is stable. Persistent airspace opacities are noted bilaterally consistent with the given clinical history. No acute abnormality noted. IMPRESSION: Persistent bilateral airspace opacities consistent with the given clinical history. Electronically Signed   By: Alcide Clever M.D.   On: 04/10/2020 14:13   DG Chest Port 1 View  Result Date: 04/05/2020 CLINICAL DATA:  Dyspnea EXAM: PORTABLE CHEST 1 VIEW COMPARISON:  05/11/2017 chest radiograph and prior. FINDINGS: Hypoinflated lungs. Patchy bibasilar opacities. No pneumothorax or pleural  effusion. Cardiomediastinal silhouette within normal limits. No acute osseous abnormality. IMPRESSION: Patchy bibasilar opacities, atelectasis versus infection. Electronically Signed   By: Stana Bunting M.D.   On: 04/05/2020 15:07      Subjective: Patient today is feeling better, continue to have dyspnea on exertion, but not at rest, her appetite has improved and her energy. No nausea or vomiting, no diarrhea.   Discharge Exam: Vitals:   04/12/20 1940 04/13/20 0431  BP: 138/73 137/68  Pulse: 83 68  Resp: 18 18  Temp: 99.6 F (37.6 C) 98.1 F (36.7 C)  SpO2: 94% (!) 89%   Vitals:   04/12/20 0608 04/12/20 1345 04/12/20 1940 04/13/20 0431  BP: 130/71  138/63 138/73 137/68  Pulse: 65 85 83 68  Resp: 18 18 18 18   Temp: 98.6 F (37 C) 99.2 F (37.3 C) 99.6 F (37.6 C) 98.1 F (36.7 C)  TempSrc: Oral Oral Oral Oral  SpO2: 90% (!) 89% 94% (!) 89%  Weight:      Height:        General: Not in pain or dyspnea at rest Neurology: Awake and alert, non focal  E ENT: no pallor, no icterus, oral mucosa moist Cardiovascular: No JVD. S1-S2 present, rhythmic, no gallops, rubs, or murmurs. No lower extremity edema. Pulmonary: positive breath sounds bilaterally Gastrointestinal. Abdomen soft and non tender Skin. No rashes Musculoskeletal: no joint deformities   The results of significant diagnostics from this hospitalization (including imaging, microbiology, ancillary and laboratory) are listed below for reference.     Microbiology: Recent Results (from the past 240 hour(s))  SARS Coronavirus 2 by RT PCR (hospital order, performed in [facility] hospital lab) Nasopharyngeal Nasopharyngeal Swab     Status: Abnormal   Collection Time: 04/05/20  2:21 PM   Specimen: Nasopharyngeal Swab  Result Value Ref Range Status   SARS Coronavirus 2 POSITIVE (A) NEGATIVE Final    Comment: RESULT CALLED TO, READ BACK BY AND VERIFIED WITH: K.ZULETA,RN 04/07/20 @1543  BY V.WILKINS (NOTE) SARS-CoV-2 target nucleic acids are DETECTED  SARS-CoV-2 RNA is generally detectable in upper respiratory specimens  during the acute phase of infection.  Positive results are indicative  of the presence of the identified virus, but do not rule out bacterial infection or co-infection with other pathogens not detected by the test.  Clinical correlation with patient history and  other diagnostic information is necessary to determine patient infection status.  The expected result is negative.  Fact Sheet for Patients:   500938   Fact Sheet for Healthcare Providers:      This test is not yet  approved or cleared by the [employer] FDA and  has been authorized for detection and/or diagnosis of SARS-CoV-2 by FDA under an Emergency Use Authorization (EUA).  This EUA will remain in effect (meaning this  test can be used) for the duration of  the COVID-19 declaration under Section 564(b)(1) of the Act, 21 U.S.C. section 360-bbb-3(b)(1), unless the authorization is terminated or revoked sooner.  Performed at [facility], [address], [place], [place]   Blood Culture (routine x 2)     Status: None   Collection Time: 04/05/20  2:34 PM   Specimen: BLOOD  Result Value Ref Range Status   Specimen Description   Final    BLOOD SITE NOT SPECIFIED Performed at [facility], [address] [place], [address] [place]    Special Requests   Final    BOTTLES DRAWN AEROBIC  AND ANAEROBIC Blood Culture adequate volume Performed at [facility], [address], [place], [place] [address]    Culture   Final    NO GROWTH 5 DAYS Performed at [facility], [address], [place], [place] [address]    Report Status 04/10/2020 FINAL  Final  Blood Culture (routine x 2)     Status: None   Collection Time: 04/05/20  2:34 PM   Specimen: BLOOD  Result Value Ref Range Status   Specimen Description   Final    BLOOD SITE NOT SPECIFIED Performed at [facility], [address], [place], [place] [address]    Special Requests   Final    BOTTLES DRAWN AEROBIC AND ANAEROBIC Blood Culture adequate volume Performed at [facility], [address] [place], [place] [address]    Culture   Final    NO GROWTH 5 DAYS Performed at [facility], [address] [place], [place] [address]    Report Status 04/10/2020 FINAL  Final     Labs: BNP (last 3 results) No results for input(s): BNP in the last 8760 hours. Basic Metabolic Panel: Recent Labs  Lab 04/09/20 0525 04/10/20 0522 04/11/20 0536  04/12/20 0439 04/13/20 0404  NA 141 139 140 140 140  K 3.7 3.7 4.0 3.7 3.4*  CL 103 101 101 102 102  CO2 26 27 30 30 29   GLUCOSE 146* 151* 136* 87 99  BUN 23* 18 19 16 18   CREATININE 0.50 0.50 0.49 0.58 0.57  CALCIUM 8.4* 8.5* 8.4* 8.1* 8.1*   Liver Function Tests: Recent Labs  Lab 04/09/20 0525 04/10/20 0522 04/11/20 0536 04/12/20 0439 04/13/20 0404  AST 21 22 17  14* 13*  ALT 23 27 26 23 21   ALKPHOS 53 51 52 47 51  BILITOT 0.3 0.3 0.6 0.6 0.6  PROT 5.9* 5.9* 5.9* 5.3* 5.4*  ALBUMIN 2.9* 3.0* 2.9* 2.7* 2.7*   No results for input(s): LIPASE, AMYLASE in the last 168 hours. No results for input(s): AMMONIA in the last 168 hours. CBC: Recent Labs  Lab 04/07/20 0527  WBC 4.9  NEUTROABS 4.0  HGB 12.5  HCT 37.5  MCV 85.2  PLT 182   Cardiac Enzymes: No results for input(s): CKTOTAL, CKMB, CKMBINDEX, TROPONINI in the last 168 hours. BNP: Invalid input(s): POCBNP CBG: No results for input(s): GLUCAP in the last 168 hours. D-Dimer Recent Labs    04/12/20 0439 04/13/20 0404  DDIMER 0.63* 0.34   Hgb A1c No results for input(s): HGBA1C in the last 72 hours. Lipid Profile No results for input(s): CHOL, HDL, LDLCALC, TRIG, CHOLHDL, LDLDIRECT in the last 72 hours. Thyroid function studies No results for input(s): TSH, T4TOTAL, T3FREE, THYROIDAB in the last 72 hours.  Invalid input(s): FREET3 Anemia work up Recent Labs    04/12/20 0439 04/13/20 0404  FERRITIN 136 141   Urinalysis    Component Value Date/Time   COLORURINE YELLOW 12/17/2016 2252   APPEARANCEUR HAZY (A) 12/17/2016 2252   LABSPEC 1.024 12/17/2016 2252   PHURINE 5.0 12/17/2016 2252   GLUCOSEU NEGATIVE 12/17/2016 2252   HGBUR NEGATIVE 12/17/2016 2252   HGBUR trace-intact 01/27/2008 0912   BILIRUBINUR NEGATIVE 12/17/2016 2252   BILIRUBINUR negative 08/15/2015 1212   KETONESUR 20 (A) 12/17/2016 2252   PROTEINUR NEGATIVE 12/17/2016 2252   UROBILINOGEN 0.2 08/15/2015 1212   UROBILINOGEN 0.2  02/07/2015 1710   NITRITE NEGATIVE 12/17/2016 2252   LEUKOCYTESUR NEGATIVE 12/17/2016 2252  Sepsis Labs Invalid input(s): PROCALCITONIN,  WBC,  LACTICIDVEN Microbiology Recent Results (from the past 240 hour(s))  SARS Coronavirus 2 by RT PCR (hospital order, performed in [facility] hospital lab) Nasopharyngeal Nasopharyngeal Swab     Status: Abnormal   Collection Time: 04/05/20  2:21 PM   Specimen: Nasopharyngeal Swab  Result Value Ref Range Status   SARS Coronavirus 2 POSITIVE (A) NEGATIVE Final    Comment: RESULT CALLED TO, READ BACK BY AND VERIFIED WITH: K.ZULETA,RN [id number] @1543  BY V.WILKINS (NOTE) SARS-CoV-2 target nucleic acids are DETECTED  SARS-CoV-2 RNA is generally detectable in upper respiratory specimens  during the acute phase of infection.  Positive results are indicative  of the presence of the identified virus, but do not rule out bacterial infection or co-infection with other pathogens not detected by the test.  Clinical correlation with patient history and  other diagnostic information is necessary to determine patient infection status.  The expected result is negative.  Fact Sheet for Patients:     Fact Sheet for Healthcare Providers:   [employer]    This test is not yet approved or cleared by the https://pope.com/ FDA and  has been authorized for detection and/or diagnosis of SARS-CoV-2 by FDA under an Emergency Use Authorization (EUA).  This EUA will remain in effect (meaning this  test can be used) for the duration of  the COVID-19 declaration under Section 564(b)(1) of the Act, 21 U.S.C. section 360-bbb-3(b)(1), unless the authorization is terminated or revoked sooner.  Performed at [facility], [address], [place], [place]   Blood Culture (routine x 2)     Status: None   Collection Time: 04/05/20  2:34 PM   Specimen: BLOOD  Result Value Ref  Range Status   Specimen Description   Final    BLOOD SITE NOT SPECIFIED Performed at [facility], [address], [place], [place]    Special Requests   Final    BOTTLES DRAWN AEROBIC AND ANAEROBIC Blood Culture adequate volume Performed at [facility], [address], [place], [place]    Culture   Final    NO GROWTH 5 DAYS Performed at [facility], [address], [place], [place]    Report Status 04/10/2020 FINAL  Final  Blood Culture (routine x 2)     Status: None   Collection Time: 04/05/20  2:34 PM   Specimen: BLOOD  Result Value Ref Range Status   Specimen Description   Final    BLOOD SITE NOT SPECIFIED Performed at [facility], [address], [place], [place]    Special Requests   Final    BOTTLES DRAWN AEROBIC AND ANAEROBIC Blood Culture adequate volume Performed at [facility], [address] [place], [place]    Culture   Final    NO GROWTH 5 DAYS Performed at [facility], [address] [place], [place]    Report Status 04/10/2020 FINAL  Final     Time coordinating discharge: 45 minutes  SIGNED:   04/12/2020, MD  [facility] 04/13/2020, 12:17 PM

## 2020-04-13 NOTE — TOC Transition Note (Signed)
Transition of Care [facility]) - CM/SW Discharge Note   Patient Details  Name: [name] MRN: [id number] Date of Birth: August 28, 1965  Transition of Care [facility]) CM/SW Contact:  Ida Rogue, LCSW Phone Number: 04/13/2020, 2:17 PM   Clinical Narrative:  Patient will discharge home today with O2, has a ride.  Note and orders seen and appreciated.  Contact Zach with [employer] who will arrange for delivery of travel cannister to room, home unit as well. No further needs identified. TOC sign off.      Barriers to Discharge: Barriers Resolved   Patient Goals and CMS Choice        Discharge Placement                       Discharge Plan and Services                                     Social Determinants of Health (SDOH) Interventions     Readmission Risk Interventions No flowsheet data found.

## 2020-04-13 NOTE — Evaluation (Signed)
Physical Therapy Evaluation Patient Details Name: [name] MRN: [id number] DOB: 1965-09-22 Today's Date: 04/13/2020   History of Present Illness  Patient is a 54 year old female past medical history for anxiety, depression, hypothyroidism, obesity, SVT and attention deficit who presented with nausea, vomiting, dizziness, weakness, dyspnea and cough. Patient diagnosed with COVID.  Clinical Impression  Pt ambulated 200' without an assistive device, no loss of balance, SaO2 82% on room air walking, 86% on 4L O2 walking. She is eager to DC home today. Encouraged pt to gradually increase activity level as tolerated, to rest when SOB. At present she qualifies for home O2. From PT standpoint, she is ready to DC home.    Follow Up Recommendations No PT follow up    Equipment Recommendations  Other (comment) (home O2)    Recommendations for Other Services       Precautions / Restrictions Precautions Precautions: None Precaution Comments: monitor sats Restrictions Weight Bearing Restrictions: No      Mobility  Bed Mobility Overal bed mobility: Modified Independent             General bed mobility comments: used rail  Transfers Overall transfer level: Independent                  Ambulation/Gait Ambulation/Gait assistance: Independent Gait Distance (Feet): 200 Feet Assistive device: None Gait Pattern/deviations: WFL(Within Functional Limits) Gait velocity: WFL   General Gait Details: SaO2 82% on RA walking, 2/4 dyspnea, 86% on 4L O2 [place] walking, no LOB  Stairs            Wheelchair Mobility    Modified Rankin (Stroke Patients Only)       Balance Overall balance assessment: No apparent balance deficits (not formally assessed)                                           Pertinent Vitals/Pain Pain Assessment: No/denies pain Faces Pain Scale: Hurts little more Pain Location: L hip/lower back Pain Descriptors / Indicators:  Cramping Pain Intervention(s): Heat applied    Home Living Family/patient expects to be discharged to:: Private residence Living Arrangements: Spouse/significant other;Children;Other relatives;Other (Comment) (son and mother in law) Available Help at Discharge: Family Type of Home: House Home Access: Stairs to enter Entrance Stairs-Rails: None Entrance Stairs-Number of Steps: 10 Home Layout: Two level;Bed/bath upstairs Home Equipment: Shower seat;Other (comment) (mother in law's bathroom has grab bars ) Additional Comments: has pulse oximeter at home; sister came from [facility] to assist at home. Pt's spouse in ICU with covid, mother in law at [place] after covid hospitalization.    Prior Function Level of Independence: Independent         Comments: pet sits and dog walks, very active PTA     Hand Dominance   Dominant Hand: Left    Extremity/Trunk Assessment   Upper Extremity Assessment Upper Extremity Assessment: Defer to OT evaluation    Lower Extremity Assessment Lower Extremity Assessment: Overall WFL for tasks assessed    Cervical / Trunk Assessment Cervical / Trunk Assessment: Normal  Communication   Communication: No difficulties  Cognition Arousal/Alertness: Awake/alert Behavior During Therapy: WFL for tasks assessed/performed Overall Cognitive Status: Within Functional Limits for tasks assessed  General Comments General comments (skin integrity, edema, etc.): patient desaturate to low 70s on 4L with functional ambulation in hallway with cues for pursed lip breathing, patient increased to 6L for remaining 69ft of ambulation and returned to bed. able to wean back to 5L O2 for seated exercises with O2 maintaining mid to high 80s    Exercises Other Exercises Other Exercises: elbow flexion + extension, shoulder diagonal pulls, horizontal abduction, shoulder retraction, and shoulder flexion x5 reps each exercise  with level 1 theraband    Assessment/Plan    PT Assessment Patent does not need any further PT services  PT Problem List         PT Treatment Interventions      PT Goals (Current goals can be found in the Care Plan section)  Acute Rehab PT Goals Patient Stated Goal: walk, return to pet sitting PT Goal Formulation: All assessment and education complete, DC therapy    Frequency     Barriers to discharge        Co-evaluation               AM-PAC PT "6 Clicks" Mobility  Outcome Measure Help needed turning from your back to your side while in a flat bed without using bedrails?: None Help needed moving from lying on your back to sitting on the side of a flat bed without using bedrails?: None Help needed moving to and from a bed to a chair (including a wheelchair)?: None Help needed standing up from a chair using your arms (e.g., wheelchair or bedside chair)?: None Help needed to walk in hospital room?: None Help needed climbing 3-5 steps with a railing? : A Little 6 Click Score: 23    End of Session Equipment Utilized During Treatment: Oxygen Activity Tolerance: Patient tolerated treatment well Patient left: in bed Nurse Communication: Mobility status      Time: 3785-8850 PT Time Calculation (min) (ACUTE ONLY): 24 min   Charges:   PT Evaluation $PT Eval Low Complexity: 1 Low PT Treatments $Gait Training: 8-22 mins        Ralene Bathe Kistler PT 04/13/2020  Acute Rehabilitation Services Pager [phone] Office [phone]

## 2020-04-27 DIAGNOSIS — R0602 Shortness of breath: Secondary | ICD-10-CM | POA: Diagnosis not present

## 2020-04-27 DIAGNOSIS — E559 Vitamin D deficiency, unspecified: Secondary | ICD-10-CM | POA: Diagnosis not present

## 2020-04-27 DIAGNOSIS — E039 Hypothyroidism, unspecified: Secondary | ICD-10-CM | POA: Diagnosis not present

## 2020-04-27 DIAGNOSIS — R079 Chest pain, unspecified: Secondary | ICD-10-CM | POA: Diagnosis not present

## 2020-04-27 DIAGNOSIS — Z8616 Personal history of COVID-19: Secondary | ICD-10-CM | POA: Diagnosis not present

## 2020-04-27 DIAGNOSIS — Z09 Encounter for follow-up examination after completed treatment for conditions other than malignant neoplasm: Secondary | ICD-10-CM | POA: Diagnosis not present

## 2020-04-27 DIAGNOSIS — R5383 Other fatigue: Secondary | ICD-10-CM | POA: Diagnosis not present

## 2020-05-13 DIAGNOSIS — J9601 Acute respiratory failure with hypoxia: Secondary | ICD-10-CM | POA: Diagnosis not present

## 2020-05-13 DIAGNOSIS — I471 Supraventricular tachycardia: Secondary | ICD-10-CM | POA: Diagnosis not present

## 2020-05-13 DIAGNOSIS — U071 COVID-19: Secondary | ICD-10-CM | POA: Diagnosis not present

## 2020-05-13 DIAGNOSIS — J1282 Pneumonia due to coronavirus disease 2019: Secondary | ICD-10-CM | POA: Diagnosis not present

## 2020-06-01 DIAGNOSIS — R0602 Shortness of breath: Secondary | ICD-10-CM | POA: Diagnosis not present

## 2020-06-01 DIAGNOSIS — Z8616 Personal history of COVID-19: Secondary | ICD-10-CM | POA: Diagnosis not present

## 2020-06-01 DIAGNOSIS — R413 Other amnesia: Secondary | ICD-10-CM | POA: Diagnosis not present

## 2020-06-01 DIAGNOSIS — R0902 Hypoxemia: Secondary | ICD-10-CM | POA: Diagnosis not present

## 2020-06-03 DIAGNOSIS — Z6841 Body Mass Index (BMI) 40.0 and over, adult: Secondary | ICD-10-CM | POA: Diagnosis not present

## 2020-06-03 DIAGNOSIS — Z01419 Encounter for gynecological examination (general) (routine) without abnormal findings: Secondary | ICD-10-CM | POA: Diagnosis not present

## 2020-06-03 DIAGNOSIS — Z1231 Encounter for screening mammogram for malignant neoplasm of breast: Secondary | ICD-10-CM | POA: Diagnosis not present

## 2020-06-13 DIAGNOSIS — U071 COVID-19: Secondary | ICD-10-CM | POA: Diagnosis not present

## 2020-06-13 DIAGNOSIS — I471 Supraventricular tachycardia: Secondary | ICD-10-CM | POA: Diagnosis not present

## 2020-06-13 DIAGNOSIS — J9601 Acute respiratory failure with hypoxia: Secondary | ICD-10-CM | POA: Diagnosis not present

## 2020-06-13 DIAGNOSIS — J1282 Pneumonia due to coronavirus disease 2019: Secondary | ICD-10-CM | POA: Diagnosis not present

## 2020-07-07 ENCOUNTER — Encounter: Payer: Self-pay | Admitting: Cardiovascular Disease

## 2020-07-30 ENCOUNTER — Other Ambulatory Visit: Payer: Self-pay

## 2020-07-30 ENCOUNTER — Encounter: Payer: Self-pay | Admitting: Pulmonary Disease

## 2020-07-30 ENCOUNTER — Ambulatory Visit (INDEPENDENT_AMBULATORY_CARE_PROVIDER_SITE_OTHER): Payer: 59 | Admitting: Pulmonary Disease

## 2020-07-30 VITALS — BP 118/72 | HR 64 | Temp 98.2°F | Ht 61.0 in | Wt 205.0 lb

## 2020-07-30 DIAGNOSIS — U071 COVID-19: Secondary | ICD-10-CM

## 2020-07-30 DIAGNOSIS — J9601 Acute respiratory failure with hypoxia: Secondary | ICD-10-CM

## 2020-07-30 DIAGNOSIS — J1282 Pneumonia due to coronavirus disease 2019: Secondary | ICD-10-CM

## 2020-07-30 NOTE — Progress Notes (Signed)
Synopsis: Referred in 07/2020 for post covid 19 pneumonia   Subjective:   PATIENT ID: [name] GENDER: female DOB: Nov 23, 1965, MRN: [id number]   HPI  Chief Complaint  Patient presents with  . Consult    Referred by PCP for history of COVID. Was diagnosed back in Sept 2021. Was discharged on 0.5L to 1L of O2. Notices SOB with activity.    [name] is a 55 year old woman, never smoker who is referred to pulmonary clinic for covid 19 pneumonia in 03/2020 with acute respiratory failure requiring supplemental oxygen.   She reports she is gradually getting better but continues to have exertional shortness of breath and chest pressure. She continues to use 0.5 to 1L of oxygen. She does have cough with some sputum production that is worse in the morning time. She has sinus congestion and post nasal drip. She also has sore throat. She denies wheezing. She does not like nasal sprays due to the irritation. She does snore at night and she has gained 10lbs since having covid. She does use albuterol occasionally and does not notice a significant difference in her breathing.  She has home physical therapy that she continues to work with.  Past Medical History:  Diagnosis Date  . ADD (attention deficit disorder)   . Allergies   . Anxiety   . Back pain   . Brachioradial pruritus   . CFS (chronic fatigue syndrome)   . Depression   . GERD (gastroesophageal reflux disease)   . Hypothyroid   . Joint pain   . Lower extremity edema   . Recurrent upper respiratory infection (URI)   . SVT (supraventricular tachycardia) (HCC) 10/19/2015  . Tachycardia   . Vitamin D deficiency      Family History  Problem Relation Age of Onset  . Cancer Father   . Diabetes Father   . Heart disease Father   . Hyperlipidemia Father   . Hypertension Father   . Depression Father   . Obesity Father   . Cancer Maternal Grandmother   . Dementia Maternal Grandmother   . Heart disease Paternal Grandmother   .  Hypertension Paternal Grandmother   . Diabetes Paternal Grandfather   . Heart disease Paternal Grandfather   . Hyperlipidemia Paternal Grandfather   . Hypertension Paternal Grandfather   . COPD Mother   . Asthma Mother   . Allergic rhinitis Mother   . Hypertension Mother   . Depression Mother   . Obesity Mother   . Alcoholism Mother   . Drug abuse Mother   . Allergic rhinitis Sister   . Liver disease Maternal Grandfather   . Cancer Maternal Grandfather   . Allergic rhinitis Sister   . Angioedema Neg Hx   . Eczema Neg Hx   . Urticaria Neg Hx      Social History   Socioeconomic History  . Marital status: Married    Spouse name: [name]  . Number of children: 1  . Years of education: Not on file  . Highest education level: Not on file  Occupational History  . Occupation: Engineer, site  Tobacco Use  . Smoking status: Never Smoker  . Smokeless tobacco: Never Used  Vaping Use  . Vaping Use: Never used  Substance and Sexual Activity  . Alcohol use: No  . Drug use: No  . Sexual activity: Not Currently  Other Topics Concern  . Not on file  Social History Narrative  . Not on file  Social Determinants of Health   Financial Resource Strain: Not on file  Food Insecurity: Not on file  Transportation Needs: Not on file  Physical Activity: Not on file  Stress: Not on file  Social Connections: Not on file  Intimate Partner Violence: Not on file     Allergies  Allergen Reactions  . Promethazine Hcl Nausea Only     Outpatient Medications Prior to Visit  Medication Sig Dispense Refill  . acetaminophen (TYLENOL) 325 MG tablet Take 2 tablets (650 mg total) by mouth every 6 (six) hours as needed for mild pain or headache (fever >/= 101).    [name] albuterol (VENTOLIN HFA) 108 (90 Base) MCG/ACT inhaler Inhale 1 puff into the lungs every 4 (four) hours as needed for wheezing or shortness of breath. 1 each 0  . buPROPion (WELLBUTRIN XL) 150 MG 24 hr tablet Take 150 mg  by mouth daily.    . citalopram (CELEXA) 40 MG tablet Take 40 mg by mouth daily.  1  . cyproheptadine (PERIACTIN) 4 MG tablet 1/2 to 1 tablet at bedtime (Patient taking differently: Take 2 mg by mouth at bedtime.) 90 tablet 3  . levothyroxine (SYNTHROID, LEVOTHROID) 75 MCG tablet Take 75 mcg by mouth daily.  0  . metoprolol tartrate (LOPRESSOR) 25 MG tablet TAKE 1 TABLET BY MOUTH EVERY MORNING AND TAKE 1/2 TABLET BY MOUTH EVERY EVENING (Patient taking differently: Take 12.5 mg by mouth every evening.) 135 tablet 2  . montelukast (SINGULAIR) 10 MG tablet Take 1 tablet (10 mg total) by mouth at bedtime. 90 tablet 3  . omeprazole (PRILOSEC) 40 MG capsule TAKE ONE CAPSULE BY MOUTH DAILY 90 capsule 3  . Probiotic Product (PROBIOTIC DAILY PO) Take 1 capsule by mouth daily.    . vitamin C (ASCORBIC ACID) 250 MG tablet Take 250 mg by mouth daily.    . Cholecalciferol (VITAMIN D) 2000 units CAPS Take 2,000 Units by mouth daily.    [name] guaiFENesin-dextromethorphan (ROBITUSSIN DM) 100-10 MG/5ML syrup Take 5 mLs by mouth every 6 (six) hours as needed for cough. 118 mL 0  . Multiple Vitamins-Minerals (ZINC PO) Take 1 tablet by mouth daily.    . Omega-3 Fatty Acids (FISH OIL) 1000 MG CAPS Take 1 capsule by mouth daily.     No facility-administered medications prior to visit.    Review of Systems  Constitutional: Positive for malaise/fatigue. Negative for chills, fever and weight loss.  HENT: Positive for congestion and sore throat.   Respiratory: Positive for cough, sputum production and shortness of breath. Negative for hemoptysis and wheezing.   Gastrointestinal: Positive for heartburn. Negative for abdominal pain, nausea and vomiting.  Genitourinary: Negative.   Musculoskeletal: Positive for joint pain.  Skin: Positive for itching.  Neurological: Negative.   Endo/Heme/Allergies: Positive for environmental allergies.  Psychiatric/Behavioral: Positive for depression. The patient is nervous/anxious.     Objective:   Vitals:   07/30/20 1147  BP: 118/72  Pulse: 64  Temp: 98.2 F (36.8 C)  TempSrc: Temporal  SpO2: 97%  Weight: 205 lb (93 kg)  Height: 5\' 1"  (1.549 m)     Physical Exam Constitutional:      General: She is not in acute distress.    Appearance: She is obese. She is not ill-appearing.  HENT:     Head: Normocephalic and atraumatic.  Eyes:     General: No scleral icterus.    Conjunctiva/sclera: Conjunctivae normal.  Cardiovascular:     Rate and Rhythm: Normal rate and regular rhythm.  Pulses: Normal pulses.     Heart sounds: Normal heart sounds. No murmur heard.   Pulmonary:     Effort: Pulmonary effort is normal.     Breath sounds: Normal breath sounds.  Abdominal:     General: Bowel sounds are normal.     Palpations: Abdomen is soft.  Musculoskeletal:     Right lower leg: No edema.     Left lower leg: No edema.  Lymphadenopathy:     Cervical: No cervical adenopathy.  Skin:    General: Skin is warm and dry.     Capillary Refill: Capillary refill takes less than 2 seconds.  Neurological:     General: No focal deficit present.     Mental Status: She is alert.  Psychiatric:        Mood and Affect: Mood normal.        Behavior: Behavior normal.        Thought Content: Thought content normal.        Judgment: Judgment normal.    CBC    Component Value Date/Time   WBC 4.9 04/07/2020 0527   RBC 4.40 04/07/2020 0527   HGB 12.5 04/07/2020 0527   HGB 11.1 09/04/2018 1318   HCT 37.5 04/07/2020 0527   HCT 32.3 (L) 09/04/2018 1318   PLT 182 04/07/2020 0527   MCV 85.2 04/07/2020 0527   MCV 83 09/04/2018 1318   MCH 28.4 04/07/2020 0527   MCHC 33.3 04/07/2020 0527   RDW 14.4 04/07/2020 0527   RDW 14.1 09/04/2018 1318   LYMPHSABS 0.5 (L) 04/07/2020 0527   LYMPHSABS 0.9 09/04/2018 1318   MONOABS 0.4 04/07/2020 0527   EOSABS 0.0 04/07/2020 0527   EOSABS 0.3 09/04/2018 1318   BASOSABS 0.0 04/07/2020 0527   BASOSABS 0.0 09/04/2018 1318   BMP  Latest Ref Rng & Units 04/13/2020 04/12/2020 04/11/2020  Glucose 70 - 99 mg/dL 99 87 308(M136(H)  BUN 6 - 20 mg/dL 18 16 19   Creatinine 0.44 - 1.00 mg/dL 5.780.57 4.690.58 6.290.49  BUN/Creat Ratio 9 - 23 - - -  Sodium 135 - 145 mmol/L 140 140 140  Potassium 3.5 - 5.1 mmol/L 3.4(L) 3.7 4.0  Chloride 98 - 111 mmol/L 102 102 101  CO2 22 - 32 mmol/L 29 30 30   Calcium 8.9 - 10.3 mg/dL 8.1(L) 8.1(L) 8.4(L)   Chest imaging: CXR 04/10/20 Cardiac shadow is stable. Persistent airspace opacities are noted bilaterally consistent with the given clinical history. No acute abnormality noted.  PFT: No flowsheet data found.  Echo: 11/05/2015 - Left ventricle: The cavity size was normal. Systolic function was  normal. The estimated ejection fraction was in the range of 60%  to 65%. Wall motion was normal; there were no regional wall  motion abnormalities. Left ventricular diastolic function  parameters were normal.  - Aortic valve: Trileaflet; normal thickness leaflets. There was no  regurgitation.  - Aortic root: The aortic root was normal in size.  - Mitral valve: Structurally normal valve. There was mild  regurgitation.  - Left atrium: The atrium was normal in size.  - Right ventricle: The cavity size was normal. Wall thickness was  normal. Systolic function was normal.  - Right atrium: The atrium was normal in size.  - Tricuspid valve: There was trivial regurgitation.  - Pulmonary arteries: Systolic pressure was within the normal  range.  - Inferior vena cava: The vessel was normal in size.  - Pericardium, extracardiac: There was no pericardial effusion.  Assessment & Plan:   Acute hypoxemic respiratory failure due to COVID-19 [facility])  Pneumonia due to COVID-19 virus  Discussion: [name] is a 55 year old woman, never smoker who is referred to pulmonary clinic for covid 19 pneumonia in 03/2020 with acute respiratory failure requiring supplemental oxygen.   She ambulated 1 lap in  clinic today on room air with oxygen saturation 97% at rest and 93% at completion. The walk was stopped due to fatigue. She does not require supplemental oxygen at rest. She can continue to use supplemental oxygen under the observation of her physical therapist. I have encouraged her to continue working out and pushing her self and some of her dyspnea is related to deconditioning. She can continue as needed use of albuterol. I have also encouraged weight loss as well. Her history of snoring is new since gaining the weight. We will hold off on sleeps testing at this time.   She is to follow up in 2 months with pulmonary function testing at time of visit. We will consider follow up chest CT imaging to monitor post covid 19 pneumonia parenchymal damage.  Melody Comas, MD [facility] Office: [phone]   See Amion for Pager Details      Current Outpatient Medications:  .  acetaminophen (TYLENOL) 325 MG tablet, Take 2 tablets (650 mg total) by mouth every 6 (six) hours as needed for mild pain or headache (fever >/= 101)., Disp: , Rfl:  .  albuterol (VENTOLIN HFA) 108 (90 Base) MCG/ACT inhaler, Inhale 1 puff into the lungs every 4 (four) hours as needed for wheezing or shortness of breath., Disp: 1 each, Rfl: 0 .  buPROPion (WELLBUTRIN XL) 150 MG 24 hr tablet, Take 150 mg by mouth daily., Disp: , Rfl:  .  citalopram (CELEXA) 40 MG tablet, Take 40 mg by mouth daily., Disp: , Rfl: 1 .  cyproheptadine (PERIACTIN) 4 MG tablet, 1/2 to 1 tablet at bedtime (Patient taking differently: Take 2 mg by mouth at bedtime.), Disp: 90 tablet, Rfl: 3 .  levothyroxine (SYNTHROID, LEVOTHROID) 75 MCG tablet, Take 75 mcg by mouth daily., Disp: , Rfl: 0 .  metoprolol tartrate (LOPRESSOR) 25 MG tablet, TAKE 1 TABLET BY MOUTH EVERY MORNING AND TAKE 1/2 TABLET BY MOUTH EVERY EVENING (Patient taking differently: Take 12.5 mg by mouth every evening.), Disp: 135 tablet, Rfl: 2 .  montelukast  (SINGULAIR) 10 MG tablet, Take 1 tablet (10 mg total) by mouth at bedtime., Disp: 90 tablet, Rfl: 3 .  omeprazole (PRILOSEC) 40 MG capsule, TAKE ONE CAPSULE BY MOUTH DAILY, Disp: 90 capsule, Rfl: 3 .  Probiotic Product (PROBIOTIC DAILY PO), Take 1 capsule by mouth daily., Disp: , Rfl:  .  vitamin C (ASCORBIC ACID) 250 MG tablet, Take 250 mg by mouth daily., Disp: , Rfl:

## 2020-07-30 NOTE — Patient Instructions (Addendum)
Continue as needed albuterol inhaler for shortness of breath, cough, wheezing or chest tightness.   You are safe to stop using supplemental oxygen at rest. You did not drop below 88% today while walking in the clinic so you are safe to stop using oxygen during ambulation around the home as well. You can wear oxygen with continued exertion such as with PT visits.  Recommend working on weight loss.  We can consider sleep test in the future if snoring persists

## 2020-08-26 ENCOUNTER — Ambulatory Visit: Payer: BC Managed Care – PPO | Admitting: Cardiovascular Disease

## 2020-09-15 ENCOUNTER — Other Ambulatory Visit: Payer: Self-pay

## 2020-09-15 ENCOUNTER — Encounter: Payer: Self-pay | Admitting: Cardiovascular Disease

## 2020-09-15 ENCOUNTER — Ambulatory Visit (INDEPENDENT_AMBULATORY_CARE_PROVIDER_SITE_OTHER): Payer: 59 | Admitting: Cardiovascular Disease

## 2020-09-15 VITALS — BP 104/70 | HR 66 | Ht 61.0 in | Wt 207.0 lb

## 2020-09-15 DIAGNOSIS — I471 Supraventricular tachycardia: Secondary | ICD-10-CM

## 2020-09-15 DIAGNOSIS — E669 Obesity, unspecified: Secondary | ICD-10-CM

## 2020-09-15 DIAGNOSIS — R002 Palpitations: Secondary | ICD-10-CM

## 2020-09-15 DIAGNOSIS — E7800 Pure hypercholesterolemia, unspecified: Secondary | ICD-10-CM | POA: Diagnosis not present

## 2020-09-15 NOTE — Patient Instructions (Signed)
Medication Instructions:  Your physician recommends that you continue on your current medications as directed. Please refer to the Current Medication list given to you today.  *If you need a refill on your cardiac medications before your next appointment, please call your pharmacy*  Lab Work: FASTING LP/CMET IN 3 MONTHS  If you have labs (blood work) drawn today and your tests are completely normal, you will receive your results only by: [name] MyChart Message (if you have MyChart) OR . A paper copy in the mail If you have any lab test that is abnormal or we need to change your treatment, we will call you to review the results.  Testing/Procedures: NONE  Follow-Up: At [facility], you and your health needs are our priority.  As part of our continuing mission to provide you with exceptional heart care, we have created designated Provider Care Teams.  These Care Teams include your primary Cardiologist (physician) and Advanced Practice Providers (APPs -  Physician Assistants and Nurse Practitioners) who all work together to provide you with the care you need, when you need it.  We recommend signing up for the patient portal called "MyChart".  Sign up information is provided on this After Visit Summary.  MyChart is used to connect with patients for Virtual Visits (Telemedicine).  Patients are able to view lab/test results, encounter notes, upcoming appointments, etc.  Non-urgent messages can be sent to your provider as well.   To learn more about what you can do with MyChart, go to [email or web address].    Your next appointment:   12 month(s)  The format for your next appointment:   In Person  Provider:   You may see DR [facility] or one of the following Advanced Practice Providers on your designated Care Team:    Marjie Skiff, PA-C  Edd Fabian, FNP  Other Instructions  TRY TO EXERCISE 150 MINUTES EACH WEEK   LIMIT YOUR FRIED/GREASY/FATTY FOODS AND CHEESES

## 2020-09-15 NOTE — Progress Notes (Signed)
Cardiology Office Note   Date:  09/15/2020   ID:  [name], DOB [birth year], MRN [id number]  PCP:  Clayborn Heron, MD  Cardiologist:   Chilton Si, MD   No chief complaint on file.     History of Present Illness: [name] is a 55 y.o. female with ADD, hypothyroidism, and SVT here for follow up.  [name] saw Dr. Beverley Fiedler on 09/2015 and reported intermittent palpitations. Five years prior she was diagnosed with SVT and briefly took atenolol.  It is unclear how this diagnosis was made. She stopped taking atenolol when she became pregnant. The palpitations recurred in 2017 and her PCP restarted atenolol, which helped the palpitations.  She reported lower extremity edema and was referred for an echo 11/05/15 that showed LVEF 60-65% and mild mitral regurgitation but was otherwise normal.  Since her last appointment she saw Corine Shelter on 06/2016 and reported increased palpitations that sounded more like PVC/PACs than sustained arrhythmias.  Atenolol was switched to metoprolol.  She has been doing very well on metoprolol.   [name] had COVID-19 pneumonia in September 2021.  She continued to require supplemental oxygen for several months afterwards.  She isn't using it anymore.  She noted a slight increase in her palpitations when she was ill but this has subsided.  She hasn't started back exercising.  She is still very tired and gets short of breath easily.  She also notes memory fog.  Her husband also had COVID-19 and was in the hospital for 11 weeks  Lately she has noted mild LE edema at times.  She denies orthopnea or PND.  She thinks that it is related to what she eats.   Past Medical History:  Diagnosis Date  . ADD (attention deficit disorder)   . Allergies   . Anxiety   . Back pain   . Brachioradial pruritus   . CFS (chronic fatigue syndrome)   . Depression   . GERD (gastroesophageal reflux disease)   . Hypothyroid   . Joint pain   . Lower extremity edema    . Recurrent upper respiratory infection (URI)   . SVT (supraventricular tachycardia) (HCC) 10/19/2015  . Tachycardia   . Vitamin D deficiency     Past Surgical History:  Procedure Laterality Date  . ADENOIDECTOMY    . BLADDER REPAIR    . CESAREAN SECTION    . LAPAROSCOPY    . myomectory       Current Outpatient Medications  Medication Sig Dispense Refill  . acetaminophen (TYLENOL) 325 MG tablet Take 2 tablets (650 mg total) by mouth every 6 (six) hours as needed for mild pain or headache (fever >/= 101).    [name] albuterol (VENTOLIN HFA) 108 (90 Base) MCG/ACT inhaler Inhale 1 puff into the lungs every 4 (four) hours as needed for wheezing or shortness of breath. 1 each 0  . buPROPion (WELLBUTRIN XL) 150 MG 24 hr tablet Take 150 mg by mouth daily.    . citalopram (CELEXA) 40 MG tablet Take 40 mg by mouth daily.  1  . cyproheptadine (PERIACTIN) 4 MG tablet 1/2 to 1 tablet at bedtime (Patient taking differently: Take 2 mg by mouth at bedtime.) 90 tablet 3  . levothyroxine (SYNTHROID, LEVOTHROID) 75 MCG tablet Take 75 mcg by mouth daily.  0  . metoprolol tartrate (LOPRESSOR) 25 MG tablet TAKE 1 TABLET BY MOUTH EVERY MORNING AND TAKE 1/2 TABLET BY MOUTH EVERY EVENING (Patient taking differently: Take 12.5 mg  by mouth every evening.) 135 tablet 2  . montelukast (SINGULAIR) 10 MG tablet Take 1 tablet (10 mg total) by mouth at bedtime. 90 tablet 3  . omeprazole (PRILOSEC) 40 MG capsule TAKE ONE CAPSULE BY MOUTH DAILY 90 capsule 3  . Probiotic Product (PROBIOTIC DAILY PO) Take 1 capsule by mouth daily.    . vitamin C (ASCORBIC ACID) 250 MG tablet Take 250 mg by mouth daily.     No current facility-administered medications for this visit.    Allergies:   Promethazine hcl    Social History:  The patient  reports that she has never smoked. She has never used smokeless tobacco. She reports that she does not drink alcohol and does not use drugs.   Family History:  The patient's family history  includes Alcoholism in her mother; Allergic rhinitis in her mother, sister, and sister; Asthma in her mother; COPD in her mother; Cancer in her father, maternal grandfather, and maternal grandmother; Dementia in her maternal grandmother; Depression in her father and mother; Diabetes in her father and paternal grandfather; Drug abuse in her mother; Heart disease in her father, paternal grandfather, and paternal grandmother; Hyperlipidemia in her father and paternal grandfather; Hypertension in her father, mother, paternal grandfather, and paternal grandmother; Liver disease in her maternal grandfather; Obesity in her father and mother.    ROS:  Please see the history of present illness.   Otherwise, review of systems are positive for none.   All other systems are reviewed and negative.    PHYSICAL EXAM: VS:  BP 104/70   Pulse 66   Ht 5\' 1"  (1.549 m)   Wt 207 lb (93.9 kg)   SpO2 98%   BMI 39.11 kg/m  , BMI Body mass index is 39.11 kg/m. GENERAL:  Well appearing HEENT:  Pupils equal round and reactive, fundi not visualized, oral mucosa unremarkable NECK:  No jugular venous distention, waveform within normal limits, carotid upstroke brisk and symmetric, no bruits, no thyromegaly LYMPHATICS:  No cervical adenopathy LUNGS:  Clear to auscultation bilaterally HEART:  RRR.  PMI not displaced or sustained,S1 and S2 within normal limits, no S3, no S4, no clicks, no rubs, no murmurs ABD:  Flat, positive bowel sounds normal in frequency in pitch, no bruits, no rebound, no guarding, no midline pulsatile mass, no hepatomegaly, no splenomegaly EXT:  2 plus pulses throughout, no edema, no cyanosis no clubbing SKIN:  No rashes no nodules NEURO:  Cranial nerves II through XII grossly intact, motor grossly intact throughout PSYCH:  Cognitively intact, oriented to person place and time   EKG:  EKG is ordered today. The ekg ordered today demonstrates sinus rhythm.  Rate 66 bpm.     Recent Labs: 04/05/2020:  Magnesium 2.1 04/07/2020: Hemoglobin 12.5; Platelets 182 04/13/2020: ALT 21; BUN 18; Creatinine, Ser 0.57; Potassium 3.4; Sodium 140   04/27/2020: WBC 5.1, hemoglobin 11.2, hematocrit 33.1, platelets 168 Sodium 142, potassium 5.0, BUN 19, creatinine 0.61 AST 16, ALT 24 TSH 2.36  Lipid Panel    Component Value Date/Time   CHOL 159 09/04/2018 1318   TRIG 116 04/05/2020 1421   HDL 40 09/04/2018 1318   CHOLHDL 4.3 02/16/2008 0400   VLDL 13 02/16/2008 0400   LDLCALC 98 09/04/2018 1318      Wt Readings from Last 3 Encounters:  09/15/20 207 lb (93.9 kg)  07/30/20 205 lb (93 kg)  04/05/20 180 lb (81.6 kg)      ASSESSMENT AND PLAN:  # SVT: Well-controlled on metoprolol.  #  Hyperlipidemia:  # Obesity:  Recommended increasing exercise to at least 150 minutes weekly.  Limit fried, fatty foods, red meat, and cheese.  Repeat lipids and a CMP in 3 months.  She has been uninterested in medications.  Consider calcium score if lipids remain elevated.   Current medicines are reviewed at length with the patient today.  The patient does not have concerns regarding medicines.  The following changes have been made:  no change  Labs/ tests ordered today include:   Orders Placed This Encounter  Procedures  . Lipid panel  . Comprehensive metabolic panel  . EKG 12-Lead     Disposition:   FU with Darrell Hauk C. Duke Salvia, MD, [facility] in 1 year     Signed, Jermayne Sweeney C. Duke Salvia, MD, [facility]  09/15/2020 10:13 AM    [facility]

## 2020-09-30 ENCOUNTER — Encounter: Payer: Self-pay | Admitting: Pulmonary Disease

## 2020-09-30 ENCOUNTER — Ambulatory Visit (INDEPENDENT_AMBULATORY_CARE_PROVIDER_SITE_OTHER): Payer: 59 | Admitting: Pulmonary Disease

## 2020-09-30 ENCOUNTER — Other Ambulatory Visit: Payer: Self-pay

## 2020-09-30 VITALS — BP 118/70 | HR 60 | Temp 97.8°F | Ht 61.0 in | Wt 207.8 lb

## 2020-09-30 DIAGNOSIS — U071 COVID-19: Secondary | ICD-10-CM

## 2020-09-30 DIAGNOSIS — J1282 Pneumonia due to coronavirus disease 2019: Secondary | ICD-10-CM | POA: Diagnosis not present

## 2020-09-30 NOTE — Patient Instructions (Addendum)
We will schedule you for pulmonary function tests.  Continue albuterol use as needed.

## 2020-09-30 NOTE — Progress Notes (Signed)
Synopsis: Referred in 07/2020 for post covid 19 pneumonia   Subjective:   PATIENT ID: [name] GENDER: female DOB: February 19, 1966, MRN: [id number]   HPI  Chief Complaint  Patient presents with  . Follow-up    2 mo f/u. States she has stable since last visit. Denies any new concerns.    [name] is a 55 year old woman, never smoker who returns to pulmonary clinic for covid 19 pneumonia in 03/2020 with acute respiratory failure requiring supplemental oxygen.   She was able to come off supplemental oxygen at the last visit on 07/30/20 based on simple walk in clinic.   She has been doing well since last visit. She continues to have exertional dyspnea when climbing stairs. She has completed physical therapy.   She reports gasping episodes at random times throughout the day which will spontaneously resolve within in seconds and no associated symptoms.   She continues with nasal congestion and drainage. She is using albuterol occasionally over the last few months and reports some relief.  Cardiology note 09/15/20 reviewed.   Past Medical History:  Diagnosis Date  . ADD (attention deficit disorder)   . Allergies   . Anxiety   . Back pain   . Brachioradial pruritus   . CFS (chronic fatigue syndrome)   . Depression   . GERD (gastroesophageal reflux disease)   . Hypothyroid   . Joint pain   . Lower extremity edema   . Recurrent upper respiratory infection (URI)   . SVT (supraventricular tachycardia) (HCC) 10/19/2015  . Tachycardia   . Vitamin D deficiency      Family History  Problem Relation Age of Onset  . Cancer Father   . Diabetes Father   . Heart disease Father   . Hyperlipidemia Father   . Hypertension Father   . Depression Father   . Obesity Father   . Cancer Maternal Grandmother   . Dementia Maternal Grandmother   . Heart disease Paternal Grandmother   . Hypertension Paternal Grandmother   . Diabetes Paternal Grandfather   . Heart disease Paternal Grandfather    . Hyperlipidemia Paternal Grandfather   . Hypertension Paternal Grandfather   . COPD Mother   . Asthma Mother   . Allergic rhinitis Mother   . Hypertension Mother   . Depression Mother   . Obesity Mother   . Alcoholism Mother   . Drug abuse Mother   . Allergic rhinitis Sister   . Liver disease Maternal Grandfather   . Cancer Maternal Grandfather   . Allergic rhinitis Sister   . Angioedema Neg Hx   . Eczema Neg Hx   . Urticaria Neg Hx      Social History   Socioeconomic History  . Marital status: Married    Spouse name: Magie Ciampa  . Number of children: 1  . Years of education: Not on file  . Highest education level: Not on file  Occupational History  . Occupation: Engineer, site  Tobacco Use  . Smoking status: Never Smoker  . Smokeless tobacco: Never Used  Vaping Use  . Vaping Use: Never used  Substance and Sexual Activity  . Alcohol use: No  . Drug use: No  . Sexual activity: Not Currently  Other Topics Concern  . Not on file  Social History Narrative  . Not on file   Social Determinants of Health   Financial Resource Strain: Not on file  Food Insecurity: Not on file  Transportation Needs: Not on file  Physical Activity: Not on file  Stress: Not on file  Social Connections: Not on file  Intimate Partner Violence: Not on file     Allergies  Allergen Reactions  . Promethazine Hcl Nausea Only     Outpatient Medications Prior to Visit  Medication Sig Dispense Refill  . acetaminophen (TYLENOL) 325 MG tablet Take 2 tablets (650 mg total) by mouth every 6 (six) hours as needed for mild pain or headache (fever >/= 101).    [name]. albuterol (VENTOLIN HFA) 108 (90 Base) MCG/ACT inhaler Inhale 1 puff into the lungs every 4 (four) hours as needed for wheezing or shortness of breath. 1 each 0  . buPROPion (WELLBUTRIN XL) 150 MG 24 hr tablet Take 150 mg by mouth daily.    . citalopram (CELEXA) 40 MG tablet Take 40 mg by mouth daily.  1  . cyproheptadine  (PERIACTIN) 4 MG tablet 1/2 to 1 tablet at bedtime (Patient taking differently: Take 2 mg by mouth at bedtime.) 90 tablet 3  . levothyroxine (SYNTHROID, LEVOTHROID) 75 MCG tablet Take 75 mcg by mouth daily.  0  . metoprolol tartrate (LOPRESSOR) 25 MG tablet TAKE 1 TABLET BY MOUTH EVERY MORNING AND TAKE 1/2 TABLET BY MOUTH EVERY EVENING (Patient taking differently: Take 12.5 mg by mouth every evening.) 135 tablet 2  . montelukast (SINGULAIR) 10 MG tablet Take 1 tablet (10 mg total) by mouth at bedtime. 90 tablet 3  . omeprazole (PRILOSEC) 40 MG capsule TAKE ONE CAPSULE BY MOUTH DAILY 90 capsule 3  . Probiotic Product (PROBIOTIC DAILY PO) Take 1 capsule by mouth daily.    . vitamin C (ASCORBIC ACID) 250 MG tablet Take 250 mg by mouth daily.     No facility-administered medications prior to visit.    Review of Systems  Constitutional: Positive for malaise/fatigue. Negative for chills, fever and weight loss.  HENT: Positive for congestion. Negative for sore throat.   Eyes: Negative.   Respiratory: Positive for shortness of breath. Negative for cough, hemoptysis, sputum production and wheezing.   Cardiovascular: Positive for leg swelling. Negative for chest pain, palpitations, orthopnea, claudication and PND.  Gastrointestinal: Positive for heartburn. Negative for abdominal pain, nausea and vomiting.  Genitourinary: Negative.   Musculoskeletal: Negative for joint pain.  Skin: Negative for itching.  Neurological: Negative.   Endo/Heme/Allergies: Positive for environmental allergies.  Psychiatric/Behavioral: Positive for depression. The patient is nervous/anxious.    Objective:   Vitals:   09/30/20 1412  BP: 118/70  Pulse: 60  Temp: 97.8 F (36.6 C)  TempSrc: Temporal  SpO2: 96%  Weight: 207 lb 12.8 oz (94.3 kg)  Height: 5\' 1"  (1.549 m)     Physical Exam Constitutional:      General: She is not in acute distress.    Appearance: She is obese. She is not ill-appearing.  HENT:      Head: Normocephalic and atraumatic.     Nose: Nose normal.     Mouth/Throat:     Mouth: Mucous membranes are moist.     Pharynx: Oropharynx is clear.  Eyes:     General: No scleral icterus.    Conjunctiva/sclera: Conjunctivae normal.  Cardiovascular:     Rate and Rhythm: Normal rate and regular rhythm.     Pulses: Normal pulses.     Heart sounds: Normal heart sounds. No murmur heard.   Pulmonary:     Effort: Pulmonary effort is normal.     Breath sounds: Normal breath sounds.  Abdominal:     General:  Bowel sounds are normal.     Palpations: Abdomen is soft.  Musculoskeletal:     Right lower leg: No edema.     Left lower leg: No edema.  Lymphadenopathy:     Cervical: No cervical adenopathy.  Skin:    General: Skin is warm and dry.     Capillary Refill: Capillary refill takes less than 2 seconds.  Neurological:     General: No focal deficit present.     Mental Status: She is alert.  Psychiatric:        Mood and Affect: Mood normal.        Behavior: Behavior normal.        Thought Content: Thought content normal.        Judgment: Judgment normal.    CBC    Component Value Date/Time   WBC 4.9 04/07/2020 0527   RBC 4.40 04/07/2020 0527   HGB 12.5 04/07/2020 0527   HGB 11.1 09/04/2018 1318   HCT 37.5 04/07/2020 0527   HCT 32.3 (L) 09/04/2018 1318   PLT 182 04/07/2020 0527   MCV 85.2 04/07/2020 0527   MCV 83 09/04/2018 1318   MCH 28.4 04/07/2020 0527   MCHC 33.3 04/07/2020 0527   RDW 14.4 04/07/2020 0527   RDW 14.1 09/04/2018 1318   LYMPHSABS 0.5 (L) 04/07/2020 0527   LYMPHSABS 0.9 09/04/2018 1318   MONOABS 0.4 04/07/2020 0527   EOSABS 0.0 04/07/2020 0527   EOSABS 0.3 09/04/2018 1318   BASOSABS 0.0 04/07/2020 0527   BASOSABS 0.0 09/04/2018 1318   BMP Latest Ref Rng & Units 04/13/2020 04/12/2020 04/11/2020  Glucose 70 - 99 mg/dL 99 87 623(J)  BUN 6 - 20 mg/dL 18 16 19   Creatinine 0.44 - 1.00 mg/dL 6.28 3.15  BUN/Creat Ratio 9 - 23 - - -  Sodium 135 - 145  mmol/L 140 140 140  Potassium 3.5 - 5.1 mmol/L 3.4(L) 3.7 4.0  Chloride 98 - 111 mmol/L 102 102 101  CO2 22 - 32 mmol/L 29 30 30   Calcium 8.9 - 10.3 mg/dL 8.1(L) 8.1(L) 8.4(L)   Chest imaging: CXR 04/10/20 Cardiac shadow is stable. Persistent airspace opacities are noted bilaterally consistent with the given clinical history. No acute abnormality noted.  PFT: No flowsheet data found.  Echo: 11/05/2015 - Left ventricle: The cavity size was normal. Systolic function was  normal. The estimated ejection fraction was in the range of 60%  to 65%. Wall motion was normal; there were no regional wall  motion abnormalities. Left ventricular diastolic function  parameters were normal.  - Aortic valve: Trileaflet; normal thickness leaflets. There was no  regurgitation.  - Aortic root: The aortic root was normal in size.  - Mitral valve: Structurally normal valve. There was mild  regurgitation.  - Left atrium: The atrium was normal in size.  - Right ventricle: The cavity size was normal. Wall thickness was  normal. Systolic function was normal.  - Right atrium: The atrium was normal in size.  - Tricuspid valve: There was trivial regurgitation.  - Pulmonary arteries: Systolic pressure was within the normal  range.  - Inferior vena cava: The vessel was normal in size.  - Pericardium, extracardiac: There was no pericardial effusion.      Assessment & Plan:   Pneumonia due to COVID-19 virus - Plan: Pulmonary Function Test  Discussion: [name] is a 55 year old woman, never smoker who returns to pulmonary clinic for covid 19 pneumonia in 03/2020.   Her acute hypoxemic  respiratory failure has resolved and she is not requiring supplemental oxygen anymore.   She continues to experience exertional dyspnea. We will check pulmonary function tests to further evaluate her lung function.   She can continue as needed albuterol use. Encouraged her to continue to increase her  physical activity as her dyspnea is in part related to deconditioning.  Encouraged weight loss.   If PFTs are abnormal we will consider further chest imaging.  Follow up in 4 months.  Melody Comas, MD [facility] Office: [phone]    Current Outpatient Medications:  .  acetaminophen (TYLENOL) 325 MG tablet, Take 2 tablets (650 mg total) by mouth every 6 (six) hours as needed for mild pain or headache (fever >/= 101)., Disp: , Rfl:  .  albuterol (VENTOLIN HFA) 108 (90 Base) MCG/ACT inhaler, Inhale 1 puff into the lungs every 4 (four) hours as needed for wheezing or shortness of breath., Disp: 1 each, Rfl: 0 .  buPROPion (WELLBUTRIN XL) 150 MG 24 hr tablet, Take 150 mg by mouth daily., Disp: , Rfl:  .  citalopram (CELEXA) 40 MG tablet, Take 40 mg by mouth daily., Disp: , Rfl: 1 .  cyproheptadine (PERIACTIN) 4 MG tablet, 1/2 to 1 tablet at bedtime (Patient taking differently: Take 2 mg by mouth at bedtime.), Disp: 90 tablet, Rfl: 3 .  levothyroxine (SYNTHROID, LEVOTHROID) 75 MCG tablet, Take 75 mcg by mouth daily., Disp: , Rfl: 0 .  metoprolol tartrate (LOPRESSOR) 25 MG tablet, TAKE 1 TABLET BY MOUTH EVERY MORNING AND TAKE 1/2 TABLET BY MOUTH EVERY EVENING (Patient taking differently: Take 12.5 mg by mouth every evening.), Disp: 135 tablet, Rfl: 2 .  montelukast (SINGULAIR) 10 MG tablet, Take 1 tablet (10 mg total) by mouth at bedtime., Disp: 90 tablet, Rfl: 3 .  omeprazole (PRILOSEC) 40 MG capsule, TAKE ONE CAPSULE BY MOUTH DAILY, Disp: 90 capsule, Rfl: 3 .  Probiotic Product (PROBIOTIC DAILY PO), Take 1 capsule by mouth daily., Disp: , Rfl:  .  vitamin C (ASCORBIC ACID) 250 MG tablet, Take 250 mg by mouth daily., Disp: , Rfl:

## 2020-10-01 ENCOUNTER — Encounter: Payer: Self-pay | Admitting: Pulmonary Disease

## 2020-10-02 ENCOUNTER — Other Ambulatory Visit (HOSPITAL_COMMUNITY)
Admission: RE | Admit: 2020-10-02 | Discharge: 2020-10-02 | Disposition: A | Discharge status: Home / Self Care | Payer: 59 | Source: Ambulatory Visit | Attending: Pulmonary Disease | Admitting: Pulmonary Disease

## 2020-10-02 DIAGNOSIS — Z20822 Contact with and (suspected) exposure to covid-19: Secondary | ICD-10-CM | POA: Diagnosis not present

## 2020-10-02 DIAGNOSIS — Z01812 Encounter for preprocedural laboratory examination: Secondary | ICD-10-CM | POA: Insufficient documentation

## 2020-10-02 LAB — SARS CORONAVIRUS 2 (TAT 6-24 HRS): SARS Coronavirus 2: NEGATIVE

## 2020-10-05 ENCOUNTER — Other Ambulatory Visit: Payer: Self-pay

## 2020-10-05 DIAGNOSIS — J1282 Pneumonia due to coronavirus disease 2019: Secondary | ICD-10-CM

## 2020-10-05 DIAGNOSIS — U071 COVID-19: Secondary | ICD-10-CM

## 2020-10-05 LAB — PULMONARY FUNCTION TEST
DL/VA % pred: 91 %
DL/VA: 3.98 ml/min/mmHg/L
DLCO cor % pred: 80 %
DLCO cor: 15.04 ml/min/mmHg
DLCO unc % pred: 80 %
DLCO unc: 15.04 ml/min/mmHg
FEF 25-75 Post: 3.81 L/sec
FEF 25-75 Pre: 3.44 L/sec
FEF2575-%Change-Post: 10 %
FEF2575-%Pred-Post: 156 %
FEF2575-%Pred-Pre: 141 %
FEV1-%Change-Post: 1 %
FEV1-%Pred-Post: 96 %
FEV1-%Pred-Pre: 95 %
FEV1-Post: 2.35 L
FEV1-Pre: 2.31 L
FEV1FVC-%Change-Post: 5 %
FEV1FVC-%Pred-Pre: 108 %
FEV6-%Change-Post: -4 %
FEV6-%Pred-Post: 85 %
FEV6-%Pred-Pre: 89 %
FEV6-Post: 2.58 L
FEV6-Pre: 2.7 L
FEV6FVC-%Pred-Post: 103 %
FEV6FVC-%Pred-Pre: 103 %
FVC-%Change-Post: -3 %
FVC-%Pred-Post: 83 %
FVC-%Pred-Pre: 87 %
FVC-Post: 2.59 L
FVC-Pre: 2.7 L
Post FEV1/FVC ratio: 91 %
Post FEV6/FVC ratio: 100 %
Pre FEV1/FVC ratio: 86 %
Pre FEV6/FVC Ratio: 100 %
RV % pred: 80 %
RV: 1.38 L
TLC % pred: 88 %
TLC: 4.07 L

## 2020-11-22 ENCOUNTER — Other Ambulatory Visit: Payer: Self-pay

## 2020-11-22 ENCOUNTER — Encounter: Payer: Self-pay | Admitting: Pulmonary Disease

## 2020-11-22 ENCOUNTER — Ambulatory Visit (INDEPENDENT_AMBULATORY_CARE_PROVIDER_SITE_OTHER): Payer: 59 | Admitting: Pulmonary Disease

## 2020-11-22 VITALS — BP 122/70 | HR 64 | Temp 97.9°F | Ht 61.0 in | Wt 213.6 lb

## 2020-11-22 DIAGNOSIS — U071 COVID-19: Secondary | ICD-10-CM

## 2020-11-22 DIAGNOSIS — J9601 Acute respiratory failure with hypoxia: Secondary | ICD-10-CM | POA: Diagnosis not present

## 2020-11-22 NOTE — Progress Notes (Signed)
Synopsis: Referred in 07/2020 for post covid 19 pneumonia   Subjective:   PATIENT ID: [name], [name]   HPI  Chief Complaint  Patient presents with  . Follow-up    2 mo f/u after COVID. States she has been doing well since last visit. Was able to complete her PFT back in March 2022.    [name] is a 55 year old woman, never smoker who returns to pulmonary clinic for covid 19 pneumonia in 03/2020 with acute respiratory failure requiring supplemental oxygen.   She was able to come off supplemental oxygen at the last visit on 07/30/20 based on simple walk in clinic.   She has been doing well since last visit and reports she has been a bit more active. She continues to have exertional dyspnea when climbing stairs.    Past Medical History:  Diagnosis Date  . ADD (attention deficit disorder)   . Allergies   . Anxiety   . Back pain   . Brachioradial pruritus   . CFS (chronic fatigue syndrome)   . Depression   . GERD (gastroesophageal reflux disease)   . Hypothyroid   . Joint pain   . Lower extremity edema   . Recurrent upper respiratory infection (URI)   . SVT (supraventricular tachycardia) (HCC) 10/19/2015  . Tachycardia   . Vitamin D deficiency      Family History  Problem Relation Age of Onset  . Cancer Father   . Diabetes Father   . Heart disease Father   . Hyperlipidemia Father   . Hypertension Father   . Depression Father   . Obesity Father   . Cancer Maternal Grandmother   . Dementia Maternal Grandmother   . Heart disease Paternal Grandmother   . Hypertension Paternal Grandmother   . Diabetes Paternal Grandfather   . Heart disease Paternal Grandfather   . Hyperlipidemia Paternal Grandfather   . Hypertension Paternal Grandfather   . COPD Mother   . Asthma Mother   . Allergic rhinitis Mother   . Hypertension Mother   . Depression Mother   . Obesity Mother   . Alcoholism Mother   . Drug abuse Mother   .  Allergic rhinitis Sister   . Liver disease Maternal Grandfather   . Cancer Maternal Grandfather   . Allergic rhinitis Sister   . Angioedema Neg Hx   . Eczema Neg Hx   . Urticaria Neg Hx      Social History   Socioeconomic History  . Marital status: Married    Spouse name: Alesana Magistro  . Number of children: 1  . Years of education: Not on file  . Highest education level: Not on file  Occupational History  . Occupation: Engineer, site  Tobacco Use  . Smoking status: Never Smoker  . Smokeless tobacco: Never Used  Vaping Use  . Vaping Use: Never used  Substance and Sexual Activity  . Alcohol use: No  . Drug use: No  . Sexual activity: Not Currently  Other Topics Concern  . Not on file  Social History Narrative  . Not on file   Social Determinants of Health   Financial Resource Strain: Not on file  Food Insecurity: Not on file  Transportation Needs: Not on file  Physical Activity: Not on file  Stress: Not on file  Social Connections: Not on file  Intimate Partner Violence: Not on file     Allergies  Allergen Reactions  . Promethazine Hcl  Nausea Only     Outpatient Medications Prior to Visit  Medication Sig Dispense Refill  . acetaminophen (TYLENOL) 325 MG tablet Take 2 tablets (650 mg total) by mouth every 6 (six) hours as needed for mild pain or headache (fever >/= 101).    [name] albuterol (VENTOLIN HFA) 108 (90 Base) MCG/ACT inhaler Inhale 1 puff into the lungs every 4 (four) hours as needed for wheezing or shortness of breath. 1 each 0  . buPROPion (WELLBUTRIN XL) 150 MG 24 hr tablet Take 150 mg by mouth daily.    . citalopram (CELEXA) 40 MG tablet Take 40 mg by mouth daily.  1  . cyproheptadine (PERIACTIN) 4 MG tablet 1/2 to 1 tablet at bedtime (Patient taking differently: Take 2 mg by mouth at bedtime.) 90 tablet 3  . levothyroxine (SYNTHROID, LEVOTHROID) 75 MCG tablet Take 75 mcg by mouth daily.  0  . metoprolol tartrate (LOPRESSOR) 25 MG tablet TAKE 1  TABLET BY MOUTH EVERY MORNING AND TAKE 1/2 TABLET BY MOUTH EVERY EVENING (Patient taking differently: Take 12.5 mg by mouth every evening.) 135 tablet 2  . montelukast (SINGULAIR) 10 MG tablet Take 1 tablet (10 mg total) by mouth at bedtime. 90 tablet 3  . omeprazole (PRILOSEC) 40 MG capsule TAKE ONE CAPSULE BY MOUTH DAILY 90 capsule 3  . Probiotic Product (PROBIOTIC DAILY PO) Take 1 capsule by mouth daily.    . vitamin C (ASCORBIC ACID) 250 MG tablet Take 250 mg by mouth daily.     No facility-administered medications prior to visit.    Review of Systems  Constitutional: Negative for chills, fever, malaise/fatigue and weight loss.  HENT: Negative for congestion and sore throat.   Eyes: Negative.   Respiratory: Positive for shortness of breath. Negative for cough, hemoptysis, sputum production and wheezing.   Cardiovascular: Negative for chest pain, palpitations, orthopnea, claudication, leg swelling and PND.  Gastrointestinal: Negative for abdominal pain, heartburn, nausea and vomiting.  Genitourinary: Negative.   Musculoskeletal: Negative for joint pain.  Skin: Negative for itching.  Neurological: Negative.   Endo/Heme/Allergies: Negative for environmental allergies.  Psychiatric/Behavioral: The patient is nervous/anxious.    Objective:   Vitals:   11/22/20 1416  BP: 122/70  Pulse: 64  Temp: 97.9 F (36.6 C)  TempSrc: Temporal  SpO2: 97%  Weight: 213 lb 9.6 oz (96.9 kg)  Height: 5\' 1"  (1.549 m)     Physical Exam Constitutional:      General: She is not in acute distress.    Appearance: She is obese. She is not ill-appearing.  HENT:     Head: Normocephalic and atraumatic.     Nose: Nose normal.     Mouth/Throat:     Mouth: Mucous membranes are moist.     Pharynx: Oropharynx is clear.  Eyes:     General: No scleral icterus.    Conjunctiva/sclera: Conjunctivae normal.  Cardiovascular:     Rate and Rhythm: Normal rate and regular rhythm.     Pulses: Normal pulses.      Heart sounds: Normal heart sounds. No murmur heard.   Pulmonary:     Effort: Pulmonary effort is normal.     Breath sounds: Normal breath sounds.  Abdominal:     General: Bowel sounds are normal.     Palpations: Abdomen is soft.  Musculoskeletal:     Right lower leg: No edema.     Left lower leg: No edema.  Lymphadenopathy:     Cervical: No cervical adenopathy.  Skin:  General: Skin is warm and dry.     Capillary Refill: Capillary refill takes less than 2 seconds.  Neurological:     General: No focal deficit present.     Mental Status: She is alert.  Psychiatric:        Mood and Affect: Mood normal.        Behavior: Behavior normal.        Thought Content: Thought content normal.        Judgment: Judgment normal.    CBC    Component Value Date/Time   WBC 4.9 04/07/2020 0527   RBC 4.40 04/07/2020 0527   HGB 12.5 04/07/2020 0527   HGB 11.1 09/04/2018 1318   HCT 37.5 04/07/2020 0527   HCT 32.3 (L) 09/04/2018 1318   PLT 182 04/07/2020 0527   MCV 85.2 04/07/2020 0527   MCV 83 09/04/2018 1318   MCH 28.4 04/07/2020 0527   MCHC 33.3 04/07/2020 0527   RDW 14.4 04/07/2020 0527   RDW 14.1 09/04/2018 1318   LYMPHSABS 0.5 (L) 04/07/2020 0527   LYMPHSABS 0.9 09/04/2018 1318   MONOABS 0.4 04/07/2020 0527   EOSABS 0.0 04/07/2020 0527   EOSABS 0.3 09/04/2018 1318   BASOSABS 0.0 04/07/2020 0527   BASOSABS 0.0 09/04/2018 1318   BMP Latest Ref Rng & Units 04/13/2020 04/12/2020 04/11/2020  Glucose 70 - 99 mg/dL 99 87 709(G)  BUN 6 - 20 mg/dL 18 16 19   Creatinine 0.44 - 1.00 mg/dL 2.83 6.62  BUN/Creat Ratio 9 - 23 - - -  Sodium 135 - 145 mmol/L 140 140 140  Potassium 3.5 - 5.1 mmol/L 3.4(L) 3.7 4.0  Chloride 98 - 111 mmol/L 102 102 101  CO2 22 - 32 mmol/L 29 30 30   Calcium 8.9 - 10.3 mg/dL 8.1(L) 8.1(L) 8.4(L)   Chest imaging: CXR 04/10/20 Cardiac shadow is stable. Persistent airspace opacities are noted bilaterally consistent with the given clinical history. No  acute abnormality noted.  PFT: PFT Results Latest Ref Rng & Units 10/05/2020  FVC-Pre L 2.70  FVC-Predicted Pre % 87  FVC-Post L 2.59  FVC-Predicted Post % 83  Pre FEV1/FVC % % 86  Post FEV1/FCV % % 91  FEV1-Pre L 2.31  FEV1-Predicted Pre % 95  FEV1-Post L 2.35  DLCO uncorrected ml/min/mmHg 15.04  DLCO UNC% % 80  DLCO corrected ml/min/mmHg 15.04  DLCO COR %Predicted % 80  DLVA Predicted % 91  TLC L 4.07  TLC % Predicted % 88  RV % Predicted % 80   PFTs 2022: Normal pulmonary function tests  Echo: 11/05/2015 - Left ventricle: The cavity size was normal. Systolic function was  normal. The estimated ejection fraction was in the range of 60%  to 65%. Wall motion was normal; there were no regional wall  motion abnormalities. Left ventricular diastolic function  parameters were normal.  - Aortic valve: Trileaflet; normal thickness leaflets. There was no  regurgitation.  - Aortic root: The aortic root was normal in size.  - Mitral valve: Structurally normal valve. There was mild  regurgitation.  - Left atrium: The atrium was normal in size.  - Right ventricle: The cavity size was normal. Wall thickness was  normal. Systolic function was normal.  - Right atrium: The atrium was normal in size.  - Tricuspid valve: There was trivial regurgitation.  - Pulmonary arteries: Systolic pressure was within the normal  range.  - Inferior vena cava: The vessel was normal in size.  - Pericardium, extracardiac: There was no  pericardial effusion.      Assessment & Plan:   Acute hypoxemic respiratory failure due to COVID-19 [facility](HCC) - Plan: Ambulatory Referral for DME  Discussion: [name] is a 55 year old woman, never smoker who returns to pulmonary clinic for covid 19 pneumonia in 03/2020.   Her acute hypoxemic respiratory failure has resolved and she is not requiring supplemental oxygen anymore. Her pulmonary function tests are within normal limits.  She is safe to  increase her physical activity as tolerated. Encouraged her to increase activity and work on weight loss.   Follow up as needed in the future.  Melody ComasJonathan Kahliyah Dick, MD [facility] Office: [phone]    Current Outpatient Medications:  .  acetaminophen (TYLENOL) 325 MG tablet, Take 2 tablets (650 mg total) by mouth every 6 (six) hours as needed for mild pain or headache (fever >/= 101)., Disp: , Rfl:  .  albuterol (VENTOLIN HFA) 108 (90 Base) MCG/ACT inhaler, Inhale 1 puff into the lungs every 4 (four) hours as needed for wheezing or shortness of breath., Disp: 1 each, Rfl: 0 .  buPROPion (WELLBUTRIN XL) 150 MG 24 hr tablet, Take 150 mg by mouth daily., Disp: , Rfl:  .  citalopram (CELEXA) 40 MG tablet, Take 40 mg by mouth daily., Disp: , Rfl: 1 .  cyproheptadine (PERIACTIN) 4 MG tablet, 1/2 to 1 tablet at bedtime (Patient taking differently: Take 2 mg by mouth at bedtime.), Disp: 90 tablet, Rfl: 3 .  levothyroxine (SYNTHROID, LEVOTHROID) 75 MCG tablet, Take 75 mcg by mouth daily., Disp: , Rfl: 0 .  metoprolol tartrate (LOPRESSOR) 25 MG tablet, TAKE 1 TABLET BY MOUTH EVERY MORNING AND TAKE 1/2 TABLET BY MOUTH EVERY EVENING (Patient taking differently: Take 12.5 mg by mouth every evening.), Disp: 135 tablet, Rfl: 2 .  montelukast (SINGULAIR) 10 MG tablet, Take 1 tablet (10 mg total) by mouth at bedtime., Disp: 90 tablet, Rfl: 3 .  omeprazole (PRILOSEC) 40 MG capsule, TAKE ONE CAPSULE BY MOUTH DAILY, Disp: 90 capsule, Rfl: 3 .  Probiotic Product (PROBIOTIC DAILY PO), Take 1 capsule by mouth daily., Disp: , Rfl:  .  vitamin C (ASCORBIC ACID) 250 MG tablet, Take 250 mg by mouth daily., Disp: , Rfl:

## 2020-11-22 NOTE — Patient Instructions (Signed)
Your pulmonary function tests are normal.   Continue to work on increasing your physical activity level.   You no longer need to follow up in pulmonary clinic at this time. Please call us if anything changes with your breathing.

## 2020-11-28 ENCOUNTER — Encounter: Payer: Self-pay | Admitting: Pulmonary Disease

## 2021-01-03 ENCOUNTER — Encounter: Payer: Self-pay | Admitting: Allergy

## 2021-01-03 ENCOUNTER — Other Ambulatory Visit: Payer: Self-pay

## 2021-01-03 ENCOUNTER — Ambulatory Visit (INDEPENDENT_AMBULATORY_CARE_PROVIDER_SITE_OTHER): Payer: 59 | Admitting: Allergy

## 2021-01-03 DIAGNOSIS — R12 Heartburn: Secondary | ICD-10-CM

## 2021-01-03 DIAGNOSIS — J302 Other seasonal allergic rhinitis: Secondary | ICD-10-CM | POA: Diagnosis not present

## 2021-01-03 DIAGNOSIS — J3089 Other allergic rhinitis: Secondary | ICD-10-CM

## 2021-01-03 MED ORDER — OMEPRAZOLE 20 MG PO CPDR: 20.0000 mg | DELAYED_RELEASE_CAPSULE | Freq: Every day | ORAL | 5 refills | Status: DC

## 2021-01-03 NOTE — Assessment & Plan Note (Signed)
Past history - 2019 skin testing was positive to grass, mold, cat. Interim history - controlled. Sleeps with 1 cat. . Continue allergen avoidance measures as best as possible.  . Continue with periactin 4mg  1/2 tablet at bedtime. . Continue with Singulair 10mg  1 tablet at bedtime. . May use Nasal saline spray (i.e., Simply Saline) or nasal saline lavage (i.e., NeilMed) as needed. . May use OTC Nasacort 1 spray per nostril 1-2 times a day for nasal symptoms as needed.

## 2021-01-03 NOTE — Patient Instructions (Addendum)
Seasonal and perennial allergic rhinitis Past history - 2019 skin testing was positive to grass, mold, cat. Continue allergen avoidance measures as best as possible.  Continue with periactin 4mg  1/2 tablet at bedtime. Continue with Singulair 10mg  1 tablet at bedtime. May use Nasal saline spray (i.e., Simply Saline) or nasal saline lavage (i.e., NeilMed) as needed. May use OTC Nasacort 1 spray per nostril 1-2 times a day for nasal symptoms as needed.   Heartburn Try omeprazole 20mg  in the morning - nothing to eat/drink for 30 minutes afterwards. If symptoms not controlled then go back to taking 40mg  dose. See below for reflux lifestyle.   Follow up in 1 year or sooner if needed.   Pet Allergen Avoidance: Contrary to popular opinion, there are no "hypoallergenic" breeds of dogs or cats. That is because people are not allergic to an animal's hair, but to an allergen found in the animal's saliva, dander (dead skin flakes) or urine. Pet allergy symptoms typically occur within minutes. For some people, symptoms can build up and become most severe 8 to 12 hours after contact with the animal. People with severe allergies can experience reactions in public places if dander has been transported on the pet owners' clothing. Keeping an animal outdoors is only a partial solution, since homes with pets in the yard still have higher concentrations of animal allergens. Before getting a pet, ask your allergist to determine if you are allergic to animals. If your pet is already considered part of your family, try to minimize contact and keep the pet out of the bedroom and other rooms where you spend a great deal of time. As with dust mites, vacuum carpets often or replace carpet with a hardwood floor, tile or linoleum. High-efficiency particulate air (HEPA) cleaners can reduce allergen levels over time. While dander and saliva are the source of cat and dog allergens, urine is the source of allergens from  rabbits, hamsters, mice and pigs; so ask a non-allergic family member to clean the animal's cage. If you have a pet allergy, talk to your allergist about the potential for allergy immunotherapy (allergy shots). This strategy can often provide long-term relief. Reducing Pollen Exposure Pollen seasons: trees (spring), grass (summer) and ragweed/weeds (fall). Keep windows closed in your home and car to lower pollen exposure.  Install air conditioning in the bedroom and throughout the house if possible.  Avoid going out in dry windy days - especially early morning. Pollen counts are highest between 5 - 10 AM and on dry, hot and windy days.  Save outside activities for late afternoon or after a heavy rain, when pollen levels are lower.  Avoid mowing of grass if you have grass pollen allergy. Be aware that pollen can also be transported indoors on people and pets.  Dry your clothes in an automatic dryer rather than hanging them outside where they might collect pollen.  Rinse hair and eyes before bedtime. Mold Control Mold and fungi can grow on a variety of surfaces provided certain temperature and moisture conditions exist.  Outdoor molds grow on plants, decaying vegetation and soil. The major outdoor mold, Alternaria and Cladosporium, are found in very high numbers during hot and dry conditions. Generally, a late summer - fall peak is seen for common outdoor fungal spores. Rain will temporarily lower outdoor mold spore count, but counts rise rapidly when the rainy period ends. The most important indoor molds are Aspergillus and Penicillium. Dark, humid and poorly ventilated basements are ideal sites for mold  growth. The next most common sites of mold growth are the bathroom and the kitchen. Outdoor (Seasonal) Mold Control Use air conditioning and keep windows closed. Avoid exposure to decaying vegetation. Avoid leaf raking. Avoid grain handling. Consider wearing a face mask if working in  moldy areas.  Indoor (Perennial) Mold Control  Maintain humidity below 50%. Get rid of mold growth on hard surfaces with water, detergent and, if necessary, 5% bleach (do not mix with other cleaners). Then dry the area completely. If mold covers an area more than 10 square feet, consider hiring an indoor environmental professional. For clothing, washing with soap and water is best. If moldy items cannot be cleaned and dried, throw them away. Remove sources e.g. contaminated carpets. Repair and seal leaking roofs or pipes. Using dehumidifiers in damp basements may be helpful, but empty the water and clean units regularly to prevent mildew from forming. All rooms, especially basements, bathrooms and kitchens, require ventilation and cleaning to deter mold and mildew growth. Avoid carpeting on concrete or damp floors, and storing items in damp areas.

## 2021-01-03 NOTE — Progress Notes (Signed)
RE: [name] MRN: [id number] DOB: Feb 02, 1966 Date of Telemedicine Visit: 01/03/2021  Referring provider: Clayborn Heron, MD Primary care provider: Soundra Pilon, FNP  Chief Complaint: Follow-up   Telemedicine Follow Up Visit via Telephone: I connected with [name] for a follow up on 01/03/21 by telephone and verified that I am speaking with the correct person using two identifiers.   I discussed the limitations, risks, security and privacy concerns of performing an evaluation and management service by telephone and the availability of in person appointments. I also discussed with the patient that there may be a patient responsible charge related to this service. The patient expressed understanding and agreed to proceed.  Patient is at home/work. Provider is at the office.  Visit start time: 11:14AM Visit end time: 11:26AM Insurance consent/check in by: front desk Medical consent and medical assistant/nurse: Ashleigh F.  History of Present Illness: She is a 55 y.o. female, who is being followed for allergic rhinitis and heartburn. Her previous allergy office visit was on 12/31/2019 with Dr. Selena Batten. Today is a regular follow up visit.  Son has a fever so had to do a televisit.  Patient was hospitalized for hypoxemia secondary to COVID-19 infection in September.  She is being followed by pulmonology for some of her residual breathing issues which are improving.   Seasonal and perennial allergic rhinitis Currently taking Singulair 10mg  daily and periactin 4mg  1/2 tablet at night with good benefit. Not using any nasal sprays.    Heartburn Takes omeprazole 40mg  in the morning with good benefit. Noticed symptoms if misses a dose. Never tried the 20mg  dose.   Assessment and Plan: [name] is a 55 y.o. female with: Seasonal and perennial allergic rhinitis Past history - 2019 skin testing was positive to grass, mold, cat. Interim history - controlled. Sleeps with 1  cat. Continue allergen avoidance measures as best as possible.  Continue with periactin 4mg  1/2 tablet at bedtime. Continue with Singulair 10mg  1 tablet at bedtime. May use Nasal saline spray (i.e., Simply Saline) or nasal saline lavage (i.e., NeilMed) as needed. May use OTC Nasacort 1 spray per nostril 1-2 times a day for nasal symptoms as needed.  Heartburn Stable with omeprazole 40mg  daily but wondering if she can wean off the medication.  Try omeprazole 20mg  in the morning - nothing to eat/drink for 30 minutes afterwards. If symptoms not controlled then go back to taking 40mg  dose. See below for reflux lifestyle.   Return in about 1 year (around 01/03/2022).  Meds ordered this encounter  Medications   omeprazole (PRILOSEC) 20 MG capsule    Sig: Take 1 capsule (20 mg total) by mouth daily.    Dispense:  30 capsule    Refill:  5    Lab Orders  No laboratory test(s) ordered today    Diagnostics: None.  Medication List:  Current Outpatient Medications  Medication Sig Dispense Refill   acetaminophen (TYLENOL) 325 MG tablet Take 2 tablets (650 mg total) by mouth every 6 (six) hours as needed for mild pain or headache (fever >/= 101).     albuterol (VENTOLIN HFA) 108 (90 Base) MCG/ACT inhaler Inhale 1 puff into the lungs every 4 (four) hours as needed for wheezing or shortness of breath. 1 each 0   buPROPion (WELLBUTRIN XL) 150 MG 24 hr tablet Take 150 mg by mouth daily.     citalopram (CELEXA) 40 MG tablet Take 40 mg by mouth daily.  1   cyproheptadine (PERIACTIN) 4 MG  tablet 1/2 to 1 tablet at bedtime (Patient taking differently: Take 2 mg by mouth at bedtime.) 90 tablet 3   levothyroxine (SYNTHROID, LEVOTHROID) 75 MCG tablet Take 75 mcg by mouth daily.  0   metoprolol tartrate (LOPRESSOR) 25 MG tablet TAKE 1 TABLET BY MOUTH EVERY MORNING AND TAKE 1/2 TABLET BY MOUTH EVERY EVENING (Patient taking differently: Take 12.5 mg by mouth every evening.) 135 tablet 2   montelukast  (SINGULAIR) 10 MG tablet Take 1 tablet (10 mg total) by mouth at bedtime. 90 tablet 3   omeprazole (PRILOSEC) 20 MG capsule Take 1 capsule (20 mg total) by mouth daily. 30 capsule 5   Probiotic Product (PROBIOTIC DAILY PO) Take 1 capsule by mouth daily.     vitamin C (ASCORBIC ACID) 250 MG tablet Take 250 mg by mouth daily.     No current facility-administered medications for this visit.   Allergies: Allergies  Allergen Reactions   Promethazine Hcl Nausea Only   I reviewed her past medical history, social history, family history, and environmental history and no significant changes have been reported from her previous visit.  Review of Systems  Constitutional:  Negative for appetite change, chills, fever and unexpected weight change.  HENT:  Negative for congestion and rhinorrhea.   Eyes:  Negative for itching.  Respiratory:  Negative for cough, chest tightness, shortness of breath and wheezing.   Gastrointestinal:  Negative for abdominal pain.  Skin:  Negative for rash.  Allergic/Immunologic: Positive for environmental allergies.  Neurological:  Negative for headaches.  Objective: Physical Exam Not obtained as encounter was done via telephone.   Previous notes and tests were reviewed.  I discussed the assessment and treatment plan with the patient. The patient was provided an opportunity to ask questions and all were answered. The patient agreed with the plan and demonstrated an understanding of the instructions. After visit summary/patient instructions available via mychart.   The patient was advised to call back or seek an in-person evaluation if the symptoms worsen or if the condition fails to improve as anticipated.  I provided 12 minutes of non-face-to-face time during this encounter.  It was my pleasure to participate in [name]'s care today. Please feel free to contact me with any questions or concerns.   Sincerely,  Wyline Mood, DO Allergy & Immunology  [facility] office: [phone] [facility] office: [phone]

## 2021-01-03 NOTE — Assessment & Plan Note (Signed)
Stable with omeprazole 40mg  daily but wondering if she can wean off the medication.  . Try omeprazole 20mg  in the morning - nothing to eat/drink for 30 minutes afterwards. . If symptoms not controlled then go back to taking 40mg  dose. . See below for reflux lifestyle.

## 2021-03-12 ENCOUNTER — Other Ambulatory Visit: Payer: Self-pay | Admitting: Allergy

## 2021-03-16 ENCOUNTER — Other Ambulatory Visit: Payer: Self-pay | Admitting: Allergy

## 2021-03-19 ENCOUNTER — Other Ambulatory Visit: Payer: Self-pay | Admitting: Allergy

## 2021-04-17 ENCOUNTER — Other Ambulatory Visit: Payer: Self-pay | Admitting: Allergy

## 2021-04-18 NOTE — Telephone Encounter (Signed)
Fill this request with 5 refills 

## 2021-05-22 ENCOUNTER — Other Ambulatory Visit: Payer: Self-pay | Admitting: Allergy

## 2021-06-12 ENCOUNTER — Other Ambulatory Visit: Payer: Self-pay | Admitting: Allergy

## 2021-08-29 ENCOUNTER — Encounter (HOSPITAL_BASED_OUTPATIENT_CLINIC_OR_DEPARTMENT_OTHER): Payer: Self-pay | Admitting: Cardiovascular Disease

## 2021-08-29 DIAGNOSIS — E7800 Pure hypercholesterolemia, unspecified: Secondary | ICD-10-CM | POA: Diagnosis not present

## 2021-08-29 LAB — LIPID PANEL
Chol/HDL Ratio: 4.4 ratio (ref 0.0–4.4)
Cholesterol, Total: 201 mg/dL — ABNORMAL HIGH (ref 100–199)
HDL: 46 mg/dL (ref >39)
LDL Chol Calc (NIH): 129 mg/dL — ABNORMAL HIGH (ref 0–99)
Triglycerides: 146 mg/dL (ref 0–149)
VLDL Cholesterol Cal: 26 mg/dL (ref 5–40)

## 2021-08-29 LAB — COMPREHENSIVE METABOLIC PANEL
ALT: 18 IU/L (ref 0–32)
AST: 17 IU/L (ref 0–40)
Albumin/Globulin Ratio: 1.8 (ref 1.2–2.2)
Albumin: 4.2 g/dL (ref 3.8–4.9)
Alkaline Phosphatase: 99 IU/L (ref 44–121)
BUN/Creatinine Ratio: 26 — ABNORMAL HIGH (ref 9–23)
BUN: 16 mg/dL (ref 6–24)
Bilirubin Total: 0.3 mg/dL (ref 0.0–1.2)
CO2: 26 mmol/L (ref 20–29)
Calcium: 9.7 mg/dL (ref 8.7–10.2)
Chloride: 103 mmol/L (ref 96–106)
Creatinine, Ser: 0.61 mg/dL (ref 0.57–1.00)
Globulin, Total: 2.4 g/dL (ref 1.5–4.5)
Glucose: 94 mg/dL (ref 70–99)
Potassium: 4.9 mmol/L (ref 3.5–5.2)
Sodium: 143 mmol/L (ref 134–144)
Total Protein: 6.6 g/dL (ref 6.0–8.5)
eGFR: 106 mL/min/{1.73_m2} (ref >59)

## 2021-09-12 ENCOUNTER — Other Ambulatory Visit: Payer: Self-pay

## 2021-09-12 ENCOUNTER — Ambulatory Visit (HOSPITAL_BASED_OUTPATIENT_CLINIC_OR_DEPARTMENT_OTHER): Payer: BC Managed Care – PPO | Admitting: Cardiovascular Disease

## 2021-09-12 ENCOUNTER — Encounter (HOSPITAL_BASED_OUTPATIENT_CLINIC_OR_DEPARTMENT_OTHER): Payer: Self-pay | Admitting: Cardiovascular Disease

## 2021-09-12 VITALS — BP 116/84 | HR 65 | Ht 61.0 in | Wt 210.1 lb

## 2021-09-12 DIAGNOSIS — E7800 Pure hypercholesterolemia, unspecified: Secondary | ICD-10-CM

## 2021-09-12 DIAGNOSIS — I471 Supraventricular tachycardia: Secondary | ICD-10-CM | POA: Diagnosis not present

## 2021-09-12 DIAGNOSIS — E669 Obesity, unspecified: Secondary | ICD-10-CM | POA: Diagnosis not present

## 2021-09-12 NOTE — Patient Instructions (Addendum)
Medication Instructions:  Your physician recommends that you continue on your current medications as directed. Please refer to the Current Medication list given to you today.   *If you need a refill on your cardiac medications before your next appointment, please call your pharmacy*  Lab Work: NONE  Testing/Procedures: NONE  Follow-Up: At [facility], you and your health needs are our priority.  As part of our continuing mission to provide you with exceptional heart care, we have created designated Provider Care Teams.  These Care Teams include your primary Cardiologist (physician) and Advanced Practice Providers (APPs -  Physician Assistants and Nurse Practitioners) who all work together to provide you with the care you need, when you need it.  We recommend signing up for the patient portal called "MyChart".  Sign up information is provided on this After Visit Summary.  MyChart is used to connect with patients for Virtual Visits (Telemedicine).  Patients are able to view lab/test results, encounter notes, upcoming appointments, etc.  Non-urgent messages can be sent to your provider as well.   To learn more about what you can do with MyChart, go to [email or web address].    Your next appointment:   12 month(s)  The format for your next appointment:   In Person  Provider:   Chilton Si, MD   You have been referred to   Where: [facility] Address: [address] [place] [address] Phone: [phone]  IF YOU DO NOT HEAR FROM THEM IN 2 WEEKS YOU CAN CALL THEM DIRECTLY AT THE NUMBER ABOVE

## 2021-09-12 NOTE — Assessment & Plan Note (Signed)
She has struggled to lose weight despite exercising regularly and following an overall healthy diet.  She was previously doing well with healthy weight and wellness.  We will refer her back.

## 2021-09-12 NOTE — Assessment & Plan Note (Signed)
Symptoms have been stable lately.  Very often stress related for her.  She can use metoprolol only as needed.

## 2021-09-12 NOTE — Assessment & Plan Note (Signed)
ASCVD 10-year risk is 2%.  Lipids are higher than we would like.  She is going to keep working on diet and exercise.  Referral to healthy weight and wellness as above.

## 2021-09-12 NOTE — Progress Notes (Signed)
Cardiology Office Note   Date:  09/12/2021   ID:  [name], DOB 07-Jan-1966, MRN [id number]  PCP:  Soundra Pilon, FNP  Cardiologist:   Chilton Si, MD   No chief complaint on file.    History of Present Illness: [name] is a 56 y.o. female with ADD, hypothyroidism, and SVT here for follow up.  [name] saw Dr. Beverley Fiedler on 09/2015 and reported intermittent palpitations. Five years prior she was diagnosed with SVT and briefly took atenolol.  It is unclear how this diagnosis was made. She stopped taking atenolol when she became pregnant. The palpitations recurred in 2017 and her PCP restarted atenolol, which helped the palpitations.  She reported lower extremity edema and was referred for an echo 11/05/15 that showed LVEF 60-65% and mild mitral regurgitation but was otherwise normal.  Since her last appointment she saw Corine Shelter on 06/2016 and reported increased palpitations that sounded more like PVC/PACs than sustained arrhythmias.  Atenolol was switched to metoprolol.  She has been doing very well on metoprolol.   [name] had COVID-19 pneumonia in September 2021.  She continued to require supplemental oxygen for several months afterwards.  At her last appointment she was doing well and denied any recurrent SVT.  Lately she has been feeling well.  She is using metoprolol twice per week.  She uses it for prevention and has not had any palpitations lately.  She notes that it is mostly stress related.  Overall her stressors have been better.  Her son is 17 now.  He has since been diagnosed with autism, ADHD, and anxiety.  Now that this has been better controlled she has fewer symptoms.  She has been swimming for exercise a few times per week.  She is eating healthy and mostly drinking water and green tea.  She is frustrated that she isn't losing weight.  She previously participated in the healthy weight and wellness clinic and found it helpful.  She denies any exertional  chest pain, shortness of breath, lower extremity edema, orthopnea, or PND.  Past Medical History:  Diagnosis Date   ADD (attention deficit disorder)    Allergies    Anxiety    Back pain    Brachioradial pruritus    CFS (chronic fatigue syndrome)    Depression    GERD (gastroesophageal reflux disease)    Hypothyroid    Joint pain    Lower extremity edema    Recurrent upper respiratory infection (URI)    SVT (supraventricular tachycardia) (HCC) 10/19/2015   Tachycardia    Vitamin D deficiency     Past Surgical History:  Procedure Laterality Date   ADENOIDECTOMY     BLADDER REPAIR     CESAREAN SECTION     LAPAROSCOPY     myomectory       Current Outpatient Medications  Medication Sig Dispense Refill   acetaminophen (TYLENOL) 325 MG tablet Take 2 tablets (650 mg total) by mouth every 6 (six) hours as needed for mild pain or headache (fever >/= 101).     albuterol (VENTOLIN HFA) 108 (90 Base) MCG/ACT inhaler Inhale 1 puff into the lungs every 4 (four) hours as needed for wheezing or shortness of breath. 1 each 0   b complex vitamins capsule Take 1 capsule by mouth daily.     buPROPion (WELLBUTRIN XL) 150 MG 24 hr tablet Take 150 mg by mouth daily.     citalopram (CELEXA) 20 MG tablet Take 40 mg by  mouth. 3 times a weel  1   cyproheptadine (PERIACTIN) 4 MG tablet TAKE 1 AND 1/2 TABLET BY MOUTH EVERY NIGHT AT BEDTIME 90 tablet 1   ECHINACEA-ZINC-VITAMIN C MT Use as directed 1 capsule in the mouth or throat daily at 12 noon.     levothyroxine (SYNTHROID, LEVOTHROID) 75 MCG tablet Take 75 mcg by mouth daily.  0   metoprolol tartrate (LOPRESSOR) 25 MG tablet Take 12.5 mg by mouth 2 (two) times a week.     montelukast (SINGULAIR) 10 MG tablet TAKE ONE TABLET BY MOUTH AT BEDTIME 90 tablet 3   Multiple Vitamins-Minerals (WOMENS MULTI VITAMIN & MINERAL PO) Take 1 tablet by mouth daily.     omeprazole (PRILOSEC) 40 MG capsule TAKE ONE CAPSULE BY MOUTH DAILY 30 capsule 5   VITAMIN  D-VITAMIN K PO Take 1 capsule by mouth daily.     No current facility-administered medications for this visit.    Allergies:   Promethazine hcl    Social History:  The patient  reports that she has never smoked. She has never used smokeless tobacco. She reports that she does not drink alcohol and does not use drugs.   Family History:  The patient's family history includes Alcoholism in her mother; Allergic rhinitis in her mother, sister, and sister; Asthma in her mother; COPD in her mother; Cancer in her father, maternal grandfather, and maternal grandmother; Dementia in her maternal grandmother; Depression in her father and mother; Diabetes in her father and paternal grandfather; Drug abuse in her mother; Heart disease in her father, paternal grandfather, and paternal grandmother; Hyperlipidemia in her father and paternal grandfather; Hypertension in her father, mother, paternal grandfather, and paternal grandmother; Liver disease in her maternal grandfather; Obesity in her father and mother.    ROS:  Please see the history of present illness.   Otherwise, review of systems are positive for none.   All other systems are reviewed and negative.    PHYSICAL EXAM: VS:  BP 116/84 (BP Location: Left Arm, Patient Position: Sitting, Cuff Size: Large)    Pulse 65    Ht 5\' 1"  (1.549 m)    Wt 210 lb 1.6 oz (95.3 kg)    BMI 39.70 kg/m  , BMI Body mass index is 39.7 kg/m. GENERAL:  Well appearing HEENT:  Pupils equal round and reactive, fundi not visualized, oral mucosa unremarkable NECK:  No jugular venous distention, waveform within normal limits, carotid upstroke brisk and symmetric, no bruits, no thyromegaly LUNGS:  Clear to auscultation bilaterally HEART:  RRR.  PMI not displaced or sustained,S1 and S2 within normal limits, no S3, no S4, no clicks, no rubs, no murmurs ABD:  Flat, positive bowel sounds normal in frequency in pitch, no bruits, no rebound, no guarding, no midline pulsatile mass, no  hepatomegaly, no splenomegaly EXT:  2 plus pulses throughout, no edema, no cyanosis no clubbing SKIN:  No rashes no nodules NEURO:  Cranial nerves II through XII grossly intact, motor grossly intact throughout PSYCH:  Cognitively intact, oriented to person place and time   EKG:  EKG is ordered today. The ekg ordered 09/15/2020 demonstrates sinus rhythm.  Rate 66 bpm.   09/12/2021: Sinus rhythm.  Rate 65 bpm.  Low voltage.   Recent Labs: 08/29/2021: ALT 18; BUN 16; Creatinine, Ser 0.61; Potassium 4.9; Sodium 143   04/27/2020: WBC 5.1, hemoglobin 11.2, hematocrit 33.1, platelets 168 Sodium 142, potassium 5.0, BUN 19, creatinine 0.61 AST 16, ALT 24 TSH 2.36  Lipid Panel  Component Value Date/Time   CHOL 201 (H) 08/29/2021 0925   TRIG 146 08/29/2021 0925   HDL 46 08/29/2021 0925   CHOLHDL 4.4 08/29/2021 0925   CHOLHDL 4.3 02/16/2008 0400   VLDL 13 02/16/2008 0400   LDLCALC 129 (H) 08/29/2021 0925      Wt Readings from Last 3 Encounters:  09/12/21 210 lb 1.6 oz (95.3 kg)  11/22/20 213 lb 9.6 oz (96.9 kg)  09/30/20 207 lb 12.8 oz (94.3 kg)      ASSESSMENT AND PLAN:  SVT (supraventricular tachycardia) (HCC) Symptoms have been stable lately.  Very often stress related for her.  She can use metoprolol only as needed.  Obesity (BMI 30-39.9) She has struggled to lose weight despite exercising regularly and following an overall healthy diet.  She was previously doing well with healthy weight and wellness.  We will refer her back.  Pure hypercholesterolemia ASCVD 10-year risk is 2%.  Lipids are higher than we would like.  She is going to keep working on diet and exercise.  Referral to healthy weight and wellness as above.    Current medicines are reviewed at length with the patient today.  The patient does not have concerns regarding medicines.  The following changes have been made:  no change  Labs/ tests ordered today include:   Orders Placed This Encounter  Procedures    Ambulatory referral to Family Practice     Disposition:   FU with Azie Mcconahy C. Oval Linsey, MD, [facility] in 1 year     Signed, Dicy Smigel C. Oval Linsey, MD, [facility]  09/12/2021 9:24 AM    [facility]

## 2021-09-22 DIAGNOSIS — Z0289 Encounter for other administrative examinations: Secondary | ICD-10-CM

## 2021-10-05 ENCOUNTER — Encounter (INDEPENDENT_AMBULATORY_CARE_PROVIDER_SITE_OTHER): Payer: Self-pay | Admitting: Family Medicine

## 2021-10-05 ENCOUNTER — Other Ambulatory Visit: Payer: Self-pay

## 2021-10-05 ENCOUNTER — Ambulatory Visit (INDEPENDENT_AMBULATORY_CARE_PROVIDER_SITE_OTHER): Payer: BC Managed Care – PPO | Admitting: Family Medicine

## 2021-10-05 VITALS — BP 124/74 | HR 69 | Temp 98.1°F | Ht 61.0 in | Wt 206.0 lb

## 2021-10-05 DIAGNOSIS — R5383 Other fatigue: Secondary | ICD-10-CM | POA: Diagnosis not present

## 2021-10-05 DIAGNOSIS — R0602 Shortness of breath: Secondary | ICD-10-CM | POA: Diagnosis not present

## 2021-10-05 DIAGNOSIS — Z1331 Encounter for screening for depression: Secondary | ICD-10-CM | POA: Diagnosis not present

## 2021-10-05 DIAGNOSIS — E669 Obesity, unspecified: Secondary | ICD-10-CM

## 2021-10-05 DIAGNOSIS — Z6839 Body mass index (BMI) 39.0-39.9, adult: Secondary | ICD-10-CM

## 2021-10-05 DIAGNOSIS — R739 Hyperglycemia, unspecified: Secondary | ICD-10-CM

## 2021-10-05 DIAGNOSIS — E559 Vitamin D deficiency, unspecified: Secondary | ICD-10-CM

## 2021-10-06 LAB — CBC WITH DIFFERENTIAL/PLATELET
Basophils Absolute: 0.1 10*3/uL (ref 0.0–0.2)
Basos: 1 %
EOS (ABSOLUTE): 0.4 10*3/uL (ref 0.0–0.4)
Eos: 6 %
Hematocrit: 37.1 % (ref 34.0–46.6)
Hemoglobin: 13.1 g/dL (ref 11.1–15.9)
Immature Grans (Abs): 0 10*3/uL (ref 0.0–0.1)
Immature Granulocytes: 1 %
Lymphocytes Absolute: 1.4 10*3/uL (ref 0.7–3.1)
Lymphs: 25 %
MCH: 29.4 pg (ref 26.6–33.0)
MCHC: 35.3 g/dL (ref 31.5–35.7)
MCV: 83 fL (ref 79–97)
Monocytes Absolute: 0.6 10*3/uL (ref 0.1–0.9)
Monocytes: 10 %
Neutrophils Absolute: 3.4 10*3/uL (ref 1.4–7.0)
Neutrophils: 57 %
Platelets: 255 10*3/uL (ref 150–450)
RBC: 4.46 x10E6/uL (ref 3.77–5.28)
RDW: 13.7 % (ref 11.7–15.4)
WBC: 5.8 10*3/uL (ref 3.4–10.8)

## 2021-10-06 LAB — COMPREHENSIVE METABOLIC PANEL
ALT: 18 IU/L (ref 0–32)
AST: 18 IU/L (ref 0–40)
Albumin/Globulin Ratio: 1.9 (ref 1.2–2.2)
Albumin: 4.2 g/dL (ref 3.8–4.9)
Alkaline Phosphatase: 88 IU/L (ref 44–121)
BUN/Creatinine Ratio: 22 (ref 9–23)
BUN: 14 mg/dL (ref 6–24)
Bilirubin Total: 0.4 mg/dL (ref 0.0–1.2)
CO2: 24 mmol/L (ref 20–29)
Calcium: 9.3 mg/dL (ref 8.7–10.2)
Chloride: 102 mmol/L (ref 96–106)
Creatinine, Ser: 0.63 mg/dL (ref 0.57–1.00)
Globulin, Total: 2.2 g/dL (ref 1.5–4.5)
Glucose: 98 mg/dL (ref 70–99)
Potassium: 4.8 mmol/L (ref 3.5–5.2)
Sodium: 139 mmol/L (ref 134–144)
Total Protein: 6.4 g/dL (ref 6.0–8.5)
eGFR: 105 mL/min/{1.73_m2} (ref >59)

## 2021-10-06 LAB — VITAMIN D 25 HYDROXY (VIT D DEFICIENCY, FRACTURES): Vit D, 25-Hydroxy: 57.3 ng/mL (ref 30.0–100.0)

## 2021-10-06 LAB — HEMOGLOBIN A1C
Est. average glucose Bld gHb Est-mCnc: 100 mg/dL
Hgb A1c MFr Bld: 5.1 % (ref 4.8–5.6)

## 2021-10-06 LAB — INSULIN, RANDOM: INSULIN: 14.8 u[IU]/mL (ref 2.6–24.9)

## 2021-10-06 LAB — FOLATE: Folate: >20 ng/mL (ref >3.0)

## 2021-10-06 LAB — VITAMIN B12: Vitamin B-12: 617 pg/mL (ref 232–1245)

## 2021-10-07 NOTE — Progress Notes (Signed)
? ? ? ? ?Chief Complaint:  ? ?OBESITY ?[name] (MR# [id number]) is a 56 y.o. female who presents for evaluation and treatment of obesity and related comorbidities. Current BMI is Body mass index is 38.92 kg/m?[name] [name] has been struggling with her weight for many years and has been unsuccessful in either losing weight, maintaining weight loss, or reaching her healthy weight goal. ? ?[name] started with our clinic almost 3 years ago, but she developed COVID and had to stop. She is ready to work on her health and weight loss again.  ? ?[name] is currently in the action stage of change and ready to dedicate time achieving and maintaining a healthier weight. [name] is interested in becoming our patient and working on intensive lifestyle modifications including (but not limited to) diet and exercise for weight loss. ? ?[name]'s habits were reviewed today and are as follows: Her family eats meals together, she struggles with family and or coworkers weight loss sabotage, her desired weight loss is 81-96 lbs, she started gaining weight after her pregnancy, her heaviest weight ever was 210 pounds, she has significant food cravings issues, she snacks frequently in the evenings, she skips meals frequently, and she frequently makes poor food choices. ? ?Depression Screen ?[name]'s Food and Mood (modified PHQ-9) score was 12. ? ? ?  10/05/2021  ?  8:54 AM  ?Depression screen PHQ 2/9  ?Decreased Interest 1  ?Down, Depressed, Hopeless 2  ?PHQ - 2 Score 3  ?Altered sleeping 2  ?Tired, decreased energy 3  ?Change in appetite 0  ?Feeling bad or failure about yourself  1  ?Trouble concentrating 3  ?Moving slowly or fidgety/restless 0  ?Suicidal thoughts 0  ?PHQ-9 Score 12  ?Difficult doing work/chores Not difficult at all  ? ?Subjective:  ? ?1. Other fatigue ?[name] admits to daytime somnolence and admits to waking up still tired. Patient has a history of symptoms of daytime fatigue, morning fatigue, and morning headache. [name] generally  gets 6 or 8 hours of sleep per night, and states that she has nightime awakenings and generally restful sleep. Snoring is present. Apneic episodes are present. Epworth Sleepiness Score is 13. [name] has a history of COVID and she notes having fatigue and shortness of breath on top of her previous symptoms. ? ?2. SOB (shortness of breath) on exertion ?[name] notes increasing shortness of breath with exercising and seems to be worsening over time with weight gain. She notes getting out of breath sooner with activity than she used to. This has not gotten worse recently. [name] denies shortness of breath at rest or orthopnea. ? ?3. Hyperglycemia ?[name] has a history of some elevated glucose readings in the past. She notes polyphagia.  ? ?4. Vitamin D deficiency ?[name] is on OTC Vitamins. She has no recent labs and she notes fatigue.  ? ?Assessment/Plan:  ? ?1. Other fatigue ?[name] does feel that her weight is causing her energy to be lower than it should be. Fatigue may be related to obesity, depression or many other causes. Labs will be ordered, and in the meanwhile, [name] will start her eating plan, and focus on self care including making healthy food choices, increasing physical activity and focusing on stress reduction. ? ?- CBC with Differential/Platelet ?- Comprehensive metabolic panel ?- Folate ?- Vitamin B12 ? ?2. SOB (shortness of breath) on exertion ?Jencie does feel that she gets out of breath more easily that she used to when she exercises. Tevis's shortness of breath appears to be obesity related  and exercise induced. She has agreed to work on weight loss and gradually increase exercise to treat her exercise induced shortness of breath. Will continue to monitor closely. ? ?3. Hyperglycemia ?Fasting labs will be obtained today, and results with be discussed with [name] in 2 weeks at her follow up visit. In the meanwhile [name] was started on the Pescatarian plan and will work on weight loss efforts. ? ?-  Hemoglobin A1c ?- Insulin, random ? ?4. Vitamin D deficiency ?We will check labs today, and we will follow up at [place] next office visit. ? ?- VITAMIN D 25 Hydroxy (Vit-D Deficiency, Fractures) ? ?5. Screening for depression ?[name] had a positive depression screening. Depression is commonly associated with obesity and often results in emotional eating behaviors. We will monitor this closely and work on CBT to help improve the non-hunger eating patterns. Referral to Psychology may be required if no improvement is seen as she continues in our clinic. ? ?6. Obesity (BMI 30-39.9) Current BMI 39.0 ?[name] is currently in the action stage of change and her goal is to continue with weight loss efforts. I recommend Itzela begin the structured treatment plan as follows: ? ?She has agreed to the [employer]. ? ?Exercise goals: No exercise has been prescribed for now, while concentrate on nutritional changes. ? ?Behavioral modification strategies: no skipping meals and meal planning and cooking strategies. ? ?She was informed of the importance of frequent follow-up visits to maximize her success with intensive lifestyle modifications for her multiple health conditions. She was informed we would discuss her lab results at her next visit unless there is a critical issue that needs to be addressed sooner. [name] agreed to keep her next visit at the agreed upon time to discuss these results. ? ?Objective:  ? ?Blood pressure 124/74, pulse 69, temperature 98.1 ?F (36.7 ?C), height 5\' 1"  (1.549 m), weight 206 lb (93.4 kg), SpO2 98 %. Body mass index is 38.92 kg/m?. ? ?EKG: Normal sinus rhythm, rate (unable to obtain). ? ?Indirect Calorimeter completed today shows a VO2 of 227 and a REE of 1570.  Her calculated basal metabolic rate is [id number] thus her basal metabolic rate is better than expected. ? ?General: Cooperative, alert, well developed, in no acute distress. ?HEENT: Conjunctivae and lids unremarkable. ?Cardiovascular: Regular  rhythm.  ?Lungs: Normal work of breathing. ?Neurologic: No focal deficits.  ? ?Lab Results  ?Component Value Date  ? CREATININE 0.63 10/05/2021  ? BUN 14 10/05/2021  ? NA 139 10/05/2021  ? K 4.8 10/05/2021  ? CL 102 10/05/2021  ? CO2 24 10/05/2021  ? ?Lab Results  ?Component Value Date  ? ALT 18 10/05/2021  ? AST 18 10/05/2021  ? ALKPHOS 88 10/05/2021  ? BILITOT 0.4 10/05/2021  ? ?Lab Results  ?Component Value Date  ? HGBA1C 5.1 10/05/2021  ? HGBA1C 5.2 09/04/2018  ? ?Lab Results  ?Component Value Date  ? INSULIN 14.8 10/05/2021  ? INSULIN 14.7 09/04/2018  ? ?Lab Results  ?Component Value Date  ? TSH 1.520 09/04/2018  ? ?Lab Results  ?Component Value Date  ? CHOL 201 (H) 08/29/2021  ? HDL 46 08/29/2021  ? LDLCALC 129 (H) 08/29/2021  ? TRIG 146 08/29/2021  ? CHOLHDL 4.4 08/29/2021  ? ?Lab Results  ?Component Value Date  ? WBC 5.8 10/05/2021  ? HGB 13.1 10/05/2021  ? HCT 37.1 10/05/2021  ? MCV 83 10/05/2021  ? PLT 255 10/05/2021  ? ?Lab Results  ?Component Value Date  ? FERRITIN 141 04/13/2020  ? ?  Attestation Statements:  ? ?Reviewed by clinician on day of visit: allergies, medications, problem list, medical history, surgical history, family history, social history, and previous encounter notes. ? ?Time spent on visit including pre-visit chart review and post-visit charting and care was 45 minutes.  ? ? ?I, Trixie Dredge, am acting as transcriptionist for Dennard Nip, MD. ? ?I have reviewed the above documentation for accuracy and completeness, and I agree with the above. - Dennard Nip, MD ? ? ?

## 2021-10-19 ENCOUNTER — Encounter (INDEPENDENT_AMBULATORY_CARE_PROVIDER_SITE_OTHER): Payer: Self-pay

## 2021-10-19 ENCOUNTER — Ambulatory Visit (INDEPENDENT_AMBULATORY_CARE_PROVIDER_SITE_OTHER): Payer: BC Managed Care – PPO | Admitting: Family Medicine

## 2021-10-25 ENCOUNTER — Ambulatory Visit (INDEPENDENT_AMBULATORY_CARE_PROVIDER_SITE_OTHER): Payer: BC Managed Care – PPO | Admitting: Family Medicine

## 2021-10-25 ENCOUNTER — Encounter (INDEPENDENT_AMBULATORY_CARE_PROVIDER_SITE_OTHER): Payer: Self-pay | Admitting: Family Medicine

## 2021-10-25 VITALS — BP 123/79 | Temp 98.1°F | Ht 61.0 in | Wt 203.0 lb

## 2021-10-25 DIAGNOSIS — Z6838 Body mass index (BMI) 38.0-38.9, adult: Secondary | ICD-10-CM

## 2021-10-25 DIAGNOSIS — E669 Obesity, unspecified: Secondary | ICD-10-CM | POA: Diagnosis not present

## 2021-10-25 DIAGNOSIS — E7800 Pure hypercholesterolemia, unspecified: Secondary | ICD-10-CM

## 2021-10-25 DIAGNOSIS — E7849 Other hyperlipidemia: Secondary | ICD-10-CM

## 2021-10-25 DIAGNOSIS — E8881 Metabolic syndrome: Secondary | ICD-10-CM | POA: Diagnosis not present

## 2021-10-27 NOTE — Progress Notes (Signed)
? ? ? ?Chief Complaint:  ? ?OBESITY ?[name] is here to discuss her progress with her obesity treatment plan along with follow-up of her obesity related diagnoses. [name] is on the [employer] and states she is following her eating plan approximately 75% of the time. [name] states she is doing 0 minutes 0 times per week. ? ?Today's visit was #: 2 ?Starting weight: 206 lbs ?Starting date: 10/05/2021 ?Today's weight: 203 lbs ?Today's date: 10/25/2021 ?Total lbs lost to date: 3 ?Total lbs lost since last in-office visit: 3 ? ?Interim History: [name] has done well with weight loss. She didn't like the Pescatarian plan so much and she is open to looking at other options.  ? ?Subjective:  ? ?1. Insulin resistance ?[name]'s fasting insulin is mildly elevated, but her fasting glucose is within normal limits. She is working on diet and exercise to improve  ? ?2. Hyperlipidemia, pure ?[name]'s LDL is above goal. She denies chest pain of CAD. She is working on decreasing cholesterol in her diet.  ? ?Assessment/Plan:  ? ?1. Insulin resistance ?[name] will continue her diet and exercise, and we will recheck labs in 3 months.  ? ?2. Hyperlipidemia, pure ?[name] will continue her diet and weight loss. We will recheck labs in 3 months.  ? ?3. Obesity with current BMI of 38.4 ?[name] is currently in the action stage of change. As such, her goal is to continue with weight loss efforts. She has agreed to change to keeping a food journal and adhering to recommended goals of 1200-1400 calories and 80+ grams of protein daily.  ? ?Behavioral modification strategies: increasing lean protein intake. ? ?[name] has agreed to follow-up with our clinic in 2 weeks. She was informed of the importance of frequent follow-up visits to maximize her success with intensive lifestyle modifications for her multiple health conditions.  ? ?Objective:  ? ?Blood pressure 123/79, temperature 98.1 ?F (36.7 ?C), height 5\' 1"  (1.549 m), weight 203 lb (92.1 kg). ?Body  mass index is 38.36 kg/m?. ? ?General: Cooperative, alert, well developed, in no acute distress. ?HEENT: Conjunctivae and lids unremarkable. ?Cardiovascular: Regular rhythm.  ?Lungs: Normal work of breathing. ?Neurologic: No focal deficits.  ? ?Lab Results  ?Component Value Date  ? CREATININE 0.63 10/05/2021  ? BUN 14 10/05/2021  ? NA 139 10/05/2021  ? K 4.8 10/05/2021  ? CL 102 10/05/2021  ? CO2 24 10/05/2021  ? ?Lab Results  ?Component Value Date  ? ALT 18 10/05/2021  ? AST 18 10/05/2021  ? ALKPHOS 88 10/05/2021  ? BILITOT 0.4 10/05/2021  ? ?Lab Results  ?Component Value Date  ? HGBA1C 5.1 10/05/2021  ? HGBA1C 5.2 09/04/2018  ? ?Lab Results  ?Component Value Date  ? INSULIN 14.8 10/05/2021  ? INSULIN 14.7 09/04/2018  ? ?Lab Results  ?Component Value Date  ? TSH 1.520 09/04/2018  ? ?Lab Results  ?Component Value Date  ? CHOL 201 (H) 08/29/2021  ? HDL 46 08/29/2021  ? LDLCALC 129 (H) 08/29/2021  ? TRIG 146 08/29/2021  ? CHOLHDL 4.4 08/29/2021  ? ?Lab Results  ?Component Value Date  ? VD25OH 57.3 10/05/2021  ? VD25OH 38.0 09/04/2018  ? ?Lab Results  ?Component Value Date  ? WBC 5.8 10/05/2021  ? HGB 13.1 10/05/2021  ? HCT 37.1 10/05/2021  ? MCV 83 10/05/2021  ? PLT 255 10/05/2021  ? ?Lab Results  ?Component Value Date  ? FERRITIN 141 04/13/2020  ? ?Attestation Statements:  ? ?Reviewed by clinician on day of visit:  allergies, medications, problem list, medical history, surgical history, family history, social history, and previous encounter notes. ? ?Time spent on visit including pre-visit chart review and post-visit care and charting was 35 minutes.  ? ? ?I, Trixie Dredge, am acting as transcriptionist for Dennard Nip, MD. ? ?I have reviewed the above documentation for accuracy and completeness, and I agree with the above. -  Dennard Nip, MD ? ? ?

## 2021-11-09 DIAGNOSIS — R300 Dysuria: Secondary | ICD-10-CM | POA: Diagnosis not present

## 2021-11-09 DIAGNOSIS — N952 Postmenopausal atrophic vaginitis: Secondary | ICD-10-CM | POA: Diagnosis not present

## 2021-11-11 ENCOUNTER — Ambulatory Visit (INDEPENDENT_AMBULATORY_CARE_PROVIDER_SITE_OTHER): Payer: BC Managed Care – PPO | Admitting: Family Medicine

## 2021-11-11 ENCOUNTER — Encounter (INDEPENDENT_AMBULATORY_CARE_PROVIDER_SITE_OTHER): Payer: Self-pay

## 2021-11-24 ENCOUNTER — Other Ambulatory Visit: Payer: Self-pay | Admitting: Allergy

## 2021-11-28 ENCOUNTER — Encounter (INDEPENDENT_AMBULATORY_CARE_PROVIDER_SITE_OTHER): Payer: Self-pay | Admitting: Family Medicine

## 2021-11-28 ENCOUNTER — Ambulatory Visit (INDEPENDENT_AMBULATORY_CARE_PROVIDER_SITE_OTHER): Payer: BC Managed Care – PPO | Admitting: Family Medicine

## 2021-11-28 VITALS — BP 121/82 | HR 63 | Temp 98.5°F | Ht 61.0 in | Wt 200.0 lb

## 2021-11-28 DIAGNOSIS — Z6837 Body mass index (BMI) 37.0-37.9, adult: Secondary | ICD-10-CM | POA: Diagnosis not present

## 2021-11-28 DIAGNOSIS — E669 Obesity, unspecified: Secondary | ICD-10-CM | POA: Diagnosis not present

## 2021-11-28 DIAGNOSIS — K219 Gastro-esophageal reflux disease without esophagitis: Secondary | ICD-10-CM

## 2021-12-13 NOTE — Progress Notes (Unsigned)
Chief Complaint:   OBESITY [name] is here to discuss her progress with her obesity treatment plan along with follow-up of her obesity related diagnoses. [name] is on keeping a food journal and adhering to recommended goals of 1200-1400 calories and 80+ grams of protein daily and states she is following her eating plan approximately 50% of the time. [name] states she is doing 0 minutes 0 times per week.  Today's visit was #: 3 Starting weight: 206 lbs Starting date: 10/05/2021 Today's weight: 200 lbs Today's date: 11/28/2021 Total lbs lost to date: 6 Total lbs lost since last in-office visit: 3  Interim History: [name] continues to do well with weight loss. She is working on not skipping meals. She struggles to meet her protein goals.   Subjective:   1. Gastroesophageal reflux disease, unspecified whether esophagitis present [name]'s symptoms are well controlled on Prilosec. She is doing well with her weight loss.   Assessment/Plan:   1. Gastroesophageal reflux disease, unspecified whether esophagitis present [name] will continue with her diet and exercise, and may be able to decrease her medications.   2. Obesity, Current BMI 37.9 [name] is currently in the action stage of change. As such, her goal is to continue with weight loss efforts. She has agreed to keeping a food journal and adhering to recommended goals of 1200-1400 calories and 80+ grams of protein daily.   Shredded [employer] were given.   Behavioral modification strategies: increasing lean protein intake and no skipping meals.  [name] has agreed to follow-up with our clinic in 3 weeks. She was informed of the importance of frequent follow-up visits to maximize her success with intensive lifestyle modifications for her multiple health conditions.   Objective:   Blood pressure 121/82, pulse 63, temperature 98.5 F (36.9 C), height 5\' 1"  (1.549 m), weight 200 lb (90.7 kg), SpO2 96 %. Body mass index is 37.79  kg/m.  General: Cooperative, alert, well developed, in no acute distress. HEENT: Conjunctivae and lids unremarkable. Cardiovascular: Regular rhythm.  Lungs: Normal work of breathing. Neurologic: No focal deficits.   Lab Results  Component Value Date   CREATININE 0.63 10/05/2021   BUN 14 10/05/2021   NA 139 10/05/2021   K 4.8 10/05/2021   CL 102 10/05/2021   CO2 24 10/05/2021   Lab Results  Component Value Date   ALT 18 10/05/2021   AST 18 10/05/2021   ALKPHOS 88 10/05/2021   BILITOT 0.4 10/05/2021   Lab Results  Component Value Date   HGBA1C 5.1 10/05/2021   HGBA1C 5.2 09/04/2018   Lab Results  Component Value Date   INSULIN 14.8 10/05/2021   INSULIN 14.7 09/04/2018   Lab Results  Component Value Date   TSH 1.520 09/04/2018   Lab Results  Component Value Date   CHOL 201 (H) 08/29/2021   HDL 46 08/29/2021   LDLCALC 129 (H) 08/29/2021   TRIG 146 08/29/2021   CHOLHDL 4.4 08/29/2021   Lab Results  Component Value Date   VD25OH 57.3 10/05/2021   VD25OH 38.0 09/04/2018   Lab Results  Component Value Date   WBC 5.8 10/05/2021   HGB 13.1 10/05/2021   HCT 37.1 10/05/2021   MCV 83 10/05/2021   PLT 255 10/05/2021   Lab Results  Component Value Date   FERRITIN 141 04/13/2020   Attestation Statements:   Reviewed by clinician on day of visit: allergies, medications, problem list, medical history, surgical history, family history, social history, and previous encounter notes.  Time spent on visit including pre-visit chart review and post-visit care and charting was 32 minutes.   I, Trixie Dredge, am acting as transcriptionist for Dennard Nip, MD.  I have reviewed the above documentation for accuracy and completeness, and I agree with the above. -  Dennard Nip, MD

## 2021-12-20 ENCOUNTER — Ambulatory Visit (INDEPENDENT_AMBULATORY_CARE_PROVIDER_SITE_OTHER): Payer: BC Managed Care – PPO | Admitting: Family Medicine

## 2021-12-20 ENCOUNTER — Encounter (INDEPENDENT_AMBULATORY_CARE_PROVIDER_SITE_OTHER): Payer: Self-pay | Admitting: Family Medicine

## 2021-12-20 VITALS — BP 136/96 | HR 68 | Temp 98.6°F | Ht 61.0 in | Wt 203.0 lb

## 2021-12-20 DIAGNOSIS — E669 Obesity, unspecified: Secondary | ICD-10-CM | POA: Diagnosis not present

## 2021-12-20 DIAGNOSIS — R030 Elevated blood-pressure reading, without diagnosis of hypertension: Secondary | ICD-10-CM

## 2021-12-20 DIAGNOSIS — Z6838 Body mass index (BMI) 38.0-38.9, adult: Secondary | ICD-10-CM

## 2021-12-22 NOTE — Progress Notes (Unsigned)
Chief Complaint:   OBESITY [name] is here to discuss her progress with her obesity treatment plan along with follow-up of her obesity related diagnoses. [name] is on keeping a food journal and adhering to recommended goals of 1200-1400 calories and 80+ grams of protein daily and states she is following her eating plan approximately 0% of the time. [name] states she is doing 0 minutes 0 times per week.  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 She is not journaling but she is trying to be mindful. She is mentally exhausted, and she notes increased temptations at work.   Subjective:   1. Elevated blood-pressure reading without diagnosis of hypertension [name] had a stressful day at work and came directly to her appointment today. She denies chest pain, but she noted a headache. Her blood pressure is normally stable.   Assessment/Plan:   1. Elevated blood-pressure reading without diagnosis of hypertension [name] is to rest tonight, and we will recheck her blood pressure in 3 weeks at her next visit. She will continue to work on her weight loss.   2. Obesity, Current BMI 38.4 [name] is currently in the action stage of change. As such, her goal is to continue with weight loss efforts. She has agreed to keeping a food journal and adhering to recommended goals of 1200-1400 calories and 80+ grams of protein daily.   Exercise goals: As is.   Behavioral modification strategies: increasing lean protein intake, meal planning and cooking strategies, and keeping a strict food journal.  [name] has agreed to follow-up with our clinic in 3 weeks. She was informed of the importance of frequent follow-up visits to maximize her success with intensive lifestyle modifications for  her multiple health conditions.   Objective:   Blood pressure (!) 136/96, pulse 68, temperature 98.6 F (37 C), height 5\' 1"  (1.549 m), weight 203 lb (92.1 kg), SpO2 95 %. Body mass index is 38.36 kg/m.  General: Cooperative, alert, well developed, in no acute distress. HEENT: Conjunctivae and lids unremarkable. Cardiovascular: Regular rhythm.  Lungs: Normal work of breathing. Neurologic: No focal deficits.   Lab Results  Component Value Date   CREATININE 0.63 10/05/2021   BUN 14 10/05/2021   NA 139 10/05/2021   K 4.8 10/05/2021   CL 102 10/05/2021   CO2 24 10/05/2021   Lab Results  Component Value Date   ALT 18 10/05/2021   AST 18 10/05/2021   ALKPHOS 88 10/05/2021   BILITOT 0.4 10/05/2021   Lab Results  Component Value Date   HGBA1C 5.1 10/05/2021   HGBA1C 5.2 09/04/2018   Lab Results  Component Value Date   INSULIN 14.8 10/05/2021   INSULIN 14.7 09/04/2018   Lab Results  Component Value Date   TSH 1.520 09/04/2018   Lab Results  Component Value Date   CHOL 201 (H) 08/29/2021   HDL 46 08/29/2021   LDLCALC 129 (H) 08/29/2021   TRIG 146 08/29/2021   CHOLHDL 4.4 08/29/2021   Lab Results  Component Value Date   VD25OH 57.3 10/05/2021   VD25OH 38.0 09/04/2018   Lab Results  Component Value Date   WBC 5.8 10/05/2021   HGB 13.1 10/05/2021   HCT 37.1 10/05/2021   MCV 83 10/05/2021   PLT 255  10/05/2021   Lab Results  Component Value Date   FERRITIN 141 04/13/2020   Attestation Statements:   Reviewed by clinician on day of visit: allergies, medications, problem list, medical history, surgical history, family history, social history, and previous encounter notes.  Time spent on visit including pre-visit chart review and post-visit care and charting was 30 minutes.   I, Trixie Dredge, am acting as transcriptionist for Dennard Nip, MD.  I have reviewed the above documentation for accuracy and completeness, and I agree with the above. -  Dennard Nip, MD

## 2021-12-26 ENCOUNTER — Other Ambulatory Visit: Payer: Self-pay | Admitting: Allergy

## 2022-01-03 ENCOUNTER — Telehealth: Payer: Self-pay | Admitting: Allergy

## 2022-01-03 MED ORDER — OMEPRAZOLE 40 MG PO CPDR
40.0000 mg | DELAYED_RELEASE_CAPSULE | Freq: Every day | ORAL | 0 refills | Status: DC
Start: 2022-01-03 — End: 2022-01-12

## 2022-01-03 NOTE — Telephone Encounter (Signed)
A courtesy refill for her omeprazole has been sent into the [facility] - [address] [place] [address].   Patient has been notified through a detailed voicemail.

## 2022-01-03 NOTE — Telephone Encounter (Signed)
Pt scheduled an appointment for 01-13-2022. Pt is requesting a courtesy refill for her omeprazole.   [facility] - [address] [place] [address]   Pt made aware if she is too miss appointment or has to reschedule no additional refills will be sent in after courtesy refill.

## 2022-01-11 NOTE — Progress Notes (Signed)
[address] [place] [address] Dept: [phone]  FOLLOW UP NOTE  Patient ID: [name], female    DOB: July 04, 1966  Age: 56 y.o. MRN: [id number] Date of Office Visit: 01/12/2022  Assessment  Chief Complaint: Allergic Rhinitis  (No issues )  HPI [name] is a 56 year old female who presents to the clinic for follow-up visit.  She was last seen in this clinic on 01/03/2021 via televisit by Dr. Selena Batten for evaluation of allergic rhinitis and reflux.  At today's visit, she reports her allergic rhinitis has been well controlled with no symptoms including rhinorrhea, nasal congestion, sneezing, or postnasal drainage.  She continues montelukast 10 mg once a day and Periactin 4 mg 1/2 to 1 tablet every night with good control of symptoms. Her last environmental allergy skin testing was on 03/14/2018 and was positive to grass pollen, cat, and mold.  Reflux is reported as well controlled with rare symptoms including occasional heartburn.  She continues omeprazole 40 mg once a day.  She continues to mostly avoid foods containing gluten or dairy.  She does report occasionally eating these foods after which she experiences nausea and abdominal bloating.  She reports that she rarely has products containing caffeine.  Her last food allergy skin testing was 03/13/2018 and was negative to the adult food panel.  She denies concomitant cardiopulmonary or integumentary symptoms with nausea and abdominal bloating.  She denie shortness of breath, cough, or wheeze with activity or rest and has not used her albuterol inhaler since her last visit to this clinic.  Her current medications are listed in the chart.  Drug Allergies:  Allergies  Allergen Reactions   Promethazine Hcl Nausea Only    Physical Exam: BP 128/80   Pulse (!) 57   Temp 97.9 F (36.6 C)   Resp 18   Ht 5' (1.524 m)   Wt 204 lb 6.4 oz (92.7 kg)   SpO2 98%   BMI 39.92 kg/m    Physical Exam Vitals reviewed.  Constitutional:       Appearance: Normal appearance.  HENT:     Head: Normocephalic and atraumatic.     Right Ear: Tympanic membrane normal.     Left Ear: Tympanic membrane normal.     Nose:     Comments: Bilateral nares slightly erythematous with clear nasal drainage noted.  Pharynx normal.  Ears normal.  Eyes normal.    Mouth/Throat:     Pharynx: Oropharynx is clear.  Eyes:     Conjunctiva/sclera: Conjunctivae normal.  Cardiovascular:     Rate and Rhythm: Normal rate and regular rhythm.     Heart sounds: Normal heart sounds. No murmur heard. Pulmonary:     Effort: Pulmonary effort is normal.     Breath sounds: Normal breath sounds.     Comments: Lungs clear to auscultation Musculoskeletal:        General: Normal range of motion.     Cervical back: Normal range of motion and neck supple.  Skin:    General: Skin is warm and dry.  Neurological:     Mental Status: She is alert and oriented to person, place, and time.  Psychiatric:        Mood and Affect: Mood normal.        Behavior: Behavior normal.        Thought Content: Thought content normal.        Judgment: Judgment normal.     Assessment and Plan: 1. Seasonal and perennial allergic rhinitis  2. Gastroesophageal reflux disease, unspecified whether esophagitis present     Meds ordered this encounter  Medications   montelukast (SINGULAIR) 10 MG tablet    Sig: Take 1 tablet (10 mg total) by mouth at bedtime.    Dispense:  90 tablet    Refill:  3   omeprazole (PRILOSEC) 40 MG capsule    Sig: Take 1 capsule (40 mg total) by mouth daily.    Dispense:  30 capsule    Refill:  5   cyproheptadine (PERIACTIN) 4 MG tablet    Sig: TAKE 1/2 to 1 TABLET BY MOUTH EVERY NIGHT AT BEDTIME    Dispense:  90 tablet    Refill:  1    Patient Instructions  Allergic rhinitis Continue allergen avoidance measures directed toward grass pollen, cat, and mold as listed below Continue montelukast 10 mg once a day to prevent allergy symptoms Continue  Periactin 4 mg 1/2 tablet at night as needed Continue Nasacort 2 sprays in each nostril once a day as needed for stuffy nose. In the right nostril, point the applicator out toward the right ear. In the left nostril, point the applicator out toward the left ear Consider saline nasal rinses as needed for nasal symptoms. Use this before any medicated nasal sprays for best result Consider allergen immunotherapy if your symptoms are not well controlled with the treatment plan as listed above  Reflux Continue dietary and lifestyle modifications as listed below Continue omeprazole 20 mg once a day.  Take this medication 30 minutes before your first meal of the day  Call the clinic if this treatment plan is not working well for you.  Follow up in 1 year or sooner if needed.   Return in about 1 year (around 01/13/2023), or if symptoms worsen or fail to improve.    Thank you for the opportunity to care for this patient.  Please do not hesitate to contact me with questions.  Thermon Leyland, FNP [facility]

## 2022-01-11 NOTE — Patient Instructions (Incomplete)
Allergic rhinitis Continue allergen avoidance measures directed toward grass pollen, cat, and mold as listed below Continue montelukast 10 mg once a day to prevent allergy symptoms Continue Periactin 4 mg 1/2 tablet at night as needed Continue Nasacort 2 sprays in each nostril once a day as needed for stuffy nose. In the right nostril, point the applicator out toward the right ear. In the left nostril, point the applicator out toward the left ear Consider saline nasal rinses as needed for nasal symptoms. Use this before any medicated nasal sprays for best result Consider allergen immunotherapy if your symptoms are not well controlled with the treatment plan as listed above  Reflux Continue dietary and lifestyle modifications as listed below Continue omeprazole 20 mg once a day.  Take this medication 30 minutes before your first meal of the day  Food intolerance Continue to avoid the foods that cause discomfort  Call the clinic if this treatment plan is not working well for you.  Follow up in 1 year or sooner if needed.  Reducing Pollen Exposure The American [employer] of [employer] suggests the following steps to reduce your exposure to pollen during allergy seasons. Do not hang sheets or clothing out to dry; pollen may collect on these items. Do not mow lawns or spend time around freshly cut grass; mowing stirs up pollen. Keep windows closed at night.  Keep car windows closed while driving. Minimize morning activities outdoors, a time when pollen counts are usually at their highest. Stay indoors as much as possible when pollen counts or humidity is high and on windy days when pollen tends to remain in the air longer. Use air conditioning when possible.  Many air conditioners have filters that trap the pollen spores. Use a HEPA room air filter to remove pollen form the indoor air you breathe.  Control of Dog or Cat Allergen Avoidance is the best way to manage a dog or  cat allergy. If you have a dog or cat and are allergic to dog or cats, consider removing the dog or cat from the home. If you have a dog or cat but don't want to find it a new home, or if your family wants a pet even though someone in the household is allergic, here are some strategies that may help keep symptoms at bay:  Keep the pet out of your bedroom and restrict it to only a few rooms. Be advised that keeping the dog or cat in only one room will not limit the allergens to that room. Don't pet, hug or kiss the dog or cat; if you do, wash your hands with soap and water. High-efficiency particulate air (HEPA) cleaners run continuously in a bedroom or living room can reduce allergen levels over time. Regular use of a high-efficiency vacuum cleaner or a central vacuum can reduce allergen levels. Giving your dog or cat a bath at least once a week can reduce airborne allergen.  Control of Mold Allergen Mold and fungi can grow on a variety of surfaces provided certain temperature and moisture conditions exist.  Outdoor molds grow on plants, decaying vegetation and soil.  The major outdoor mold, Alternaria and Cladosporium, are found in very high numbers during hot and dry conditions.  Generally, a late Summer - Fall peak is seen for common outdoor fungal spores.  Rain will temporarily lower outdoor mold spore count, but counts rise rapidly when the rainy period ends.  The most important indoor molds are Aspergillus and Penicillium.  Dark, humid and poorly ventilated basements are ideal sites for mold growth.  The next most common sites of mold growth are the bathroom and the kitchen.  Outdoor [employer] Use air conditioning and keep windows closed Avoid exposure to decaying vegetation. Avoid leaf raking. Avoid grain handling. Consider wearing a face mask if working in moldy areas.  Indoor Mold Control Maintain humidity below 50%. Clean washable surfaces with 5% bleach solution. Remove sources  e.g. Contaminated carpets.   Lifestyle Changes for Controlling GERD When you have GERD, stomach acid feels as if it's backing up toward your mouth. Whether or not you take medication to control your GERD, your symptoms can often be improved with lifestyle changes.   Raise Your Head Reflux is more likely to strike when you're lying down flat, because stomach fluid can flow backward more easily. Raising the head of your bed 4-6 inches can help. To do this: Slide blocks or books under the legs at the head of your bed. Or, place a wedge under the mattress. Many foam stores can make a suitable wedge for you. The wedge should run from your waist to the top of your head. Don't just prop your head on several pillows. This increases pressure on your stomach. It can make GERD worse.  Watch Your Eating Habits Certain foods may increase the acid in your stomach or relax the lower esophageal sphincter, making GERD more likely. It's best to avoid the following: Coffee, tea, and carbonated drinks (with and without caffeine) Fatty, fried, or spicy food Mint, chocolate, onions, and tomatoes Any other foods that seem to irritate your stomach or cause you pain  Relieve the Pressure Eat smaller meals, even if you have to eat more often. Don't lie down right after you eat. Wait a few hours for your stomach to empty. Avoid tight belts and tight-fitting clothes. Lose excess weight.  Tobacco and Alcohol Avoid smoking tobacco and drinking alcohol. They can make GERD symptoms worse.

## 2022-01-12 ENCOUNTER — Ambulatory Visit: Payer: BC Managed Care – PPO | Admitting: Family Medicine

## 2022-01-12 ENCOUNTER — Ambulatory Visit (INDEPENDENT_AMBULATORY_CARE_PROVIDER_SITE_OTHER): Payer: BC Managed Care – PPO | Admitting: Family Medicine

## 2022-01-12 ENCOUNTER — Encounter (INDEPENDENT_AMBULATORY_CARE_PROVIDER_SITE_OTHER): Payer: Self-pay

## 2022-01-12 ENCOUNTER — Other Ambulatory Visit: Payer: Self-pay

## 2022-01-12 ENCOUNTER — Encounter: Payer: Self-pay | Admitting: Family Medicine

## 2022-01-12 VITALS — BP 128/80 | HR 57 | Temp 97.9°F | Resp 18 | Ht 60.0 in | Wt 204.4 lb

## 2022-01-12 DIAGNOSIS — J3089 Other allergic rhinitis: Secondary | ICD-10-CM | POA: Diagnosis not present

## 2022-01-12 DIAGNOSIS — K219 Gastro-esophageal reflux disease without esophagitis: Secondary | ICD-10-CM

## 2022-01-12 DIAGNOSIS — F3340 Major depressive disorder, recurrent, in remission, unspecified: Secondary | ICD-10-CM | POA: Insufficient documentation

## 2022-01-12 DIAGNOSIS — F5101 Primary insomnia: Secondary | ICD-10-CM | POA: Insufficient documentation

## 2022-01-12 DIAGNOSIS — E559 Vitamin D deficiency, unspecified: Secondary | ICD-10-CM | POA: Insufficient documentation

## 2022-01-12 DIAGNOSIS — R413 Other amnesia: Secondary | ICD-10-CM | POA: Insufficient documentation

## 2022-01-12 DIAGNOSIS — G4733 Obstructive sleep apnea (adult) (pediatric): Secondary | ICD-10-CM | POA: Insufficient documentation

## 2022-01-12 DIAGNOSIS — J302 Other seasonal allergic rhinitis: Secondary | ICD-10-CM

## 2022-01-12 DIAGNOSIS — R5383 Other fatigue: Secondary | ICD-10-CM | POA: Insufficient documentation

## 2022-01-12 DIAGNOSIS — F331 Major depressive disorder, recurrent, moderate: Secondary | ICD-10-CM | POA: Insufficient documentation

## 2022-01-12 DIAGNOSIS — T781XXA Other adverse food reactions, not elsewhere classified, initial encounter: Secondary | ICD-10-CM | POA: Insufficient documentation

## 2022-01-12 MED ORDER — CYPROHEPTADINE HCL 4 MG PO TABS: ORAL_TABLET | ORAL | 1 refills | Status: DC

## 2022-01-12 MED ORDER — MONTELUKAST SODIUM 10 MG PO TABS: 10.0000 mg | ORAL_TABLET | Freq: Every day | ORAL | 3 refills | Status: DC

## 2022-01-12 MED ORDER — OMEPRAZOLE 40 MG PO CPDR: 40.0000 mg | DELAYED_RELEASE_CAPSULE | Freq: Every day | ORAL | 5 refills | Status: DC

## 2022-01-13 ENCOUNTER — Ambulatory Visit: Payer: BC Managed Care – PPO | Admitting: Internal Medicine

## 2022-02-03 ENCOUNTER — Other Ambulatory Visit: Payer: Self-pay | Admitting: Allergy

## 2022-02-10 DIAGNOSIS — R300 Dysuria: Secondary | ICD-10-CM | POA: Diagnosis not present

## 2022-02-22 ENCOUNTER — Encounter (INDEPENDENT_AMBULATORY_CARE_PROVIDER_SITE_OTHER): Payer: Self-pay

## 2022-02-24 DIAGNOSIS — R300 Dysuria: Secondary | ICD-10-CM | POA: Diagnosis not present

## 2022-02-24 DIAGNOSIS — N390 Urinary tract infection, site not specified: Secondary | ICD-10-CM | POA: Diagnosis not present

## 2022-03-27 DIAGNOSIS — Z87440 Personal history of urinary (tract) infections: Secondary | ICD-10-CM | POA: Diagnosis not present

## 2022-03-27 DIAGNOSIS — N3946 Mixed incontinence: Secondary | ICD-10-CM | POA: Diagnosis not present

## 2022-03-27 DIAGNOSIS — N952 Postmenopausal atrophic vaginitis: Secondary | ICD-10-CM | POA: Diagnosis not present

## 2022-03-27 DIAGNOSIS — M6289 Other specified disorders of muscle: Secondary | ICD-10-CM | POA: Diagnosis not present

## 2022-04-06 DIAGNOSIS — R059 Cough, unspecified: Secondary | ICD-10-CM | POA: Diagnosis not present

## 2022-04-06 DIAGNOSIS — U071 COVID-19: Secondary | ICD-10-CM | POA: Diagnosis not present

## 2022-04-06 DIAGNOSIS — R5383 Other fatigue: Secondary | ICD-10-CM | POA: Diagnosis not present

## 2022-04-28 DIAGNOSIS — M531 Cervicobrachial syndrome: Secondary | ICD-10-CM | POA: Diagnosis not present

## 2022-04-28 DIAGNOSIS — M9903 Segmental and somatic dysfunction of lumbar region: Secondary | ICD-10-CM | POA: Diagnosis not present

## 2022-04-28 DIAGNOSIS — M9901 Segmental and somatic dysfunction of cervical region: Secondary | ICD-10-CM | POA: Diagnosis not present

## 2022-04-28 DIAGNOSIS — M9902 Segmental and somatic dysfunction of thoracic region: Secondary | ICD-10-CM | POA: Diagnosis not present

## 2022-06-27 DIAGNOSIS — N952 Postmenopausal atrophic vaginitis: Secondary | ICD-10-CM | POA: Diagnosis not present

## 2022-06-27 DIAGNOSIS — M6289 Other specified disorders of muscle: Secondary | ICD-10-CM | POA: Diagnosis not present

## 2022-06-27 DIAGNOSIS — Z87440 Personal history of urinary (tract) infections: Secondary | ICD-10-CM | POA: Diagnosis not present

## 2022-06-27 DIAGNOSIS — N3946 Mixed incontinence: Secondary | ICD-10-CM | POA: Diagnosis not present

## 2022-07-07 DIAGNOSIS — F411 Generalized anxiety disorder: Secondary | ICD-10-CM | POA: Diagnosis not present

## 2022-07-07 DIAGNOSIS — F3341 Major depressive disorder, recurrent, in partial remission: Secondary | ICD-10-CM | POA: Diagnosis not present

## 2022-07-07 DIAGNOSIS — E039 Hypothyroidism, unspecified: Secondary | ICD-10-CM | POA: Diagnosis not present

## 2022-07-07 DIAGNOSIS — E559 Vitamin D deficiency, unspecified: Secondary | ICD-10-CM | POA: Diagnosis not present

## 2022-07-27 IMAGING — DX DG CHEST 1V
1 series · 1 of 1 positions shown · non-contrast
Comparison: 04/05/2020

CLINICAL DATA: [id number] positivity with shortness of breath

EXAM:
CHEST  1 VIEW

[chest ap]
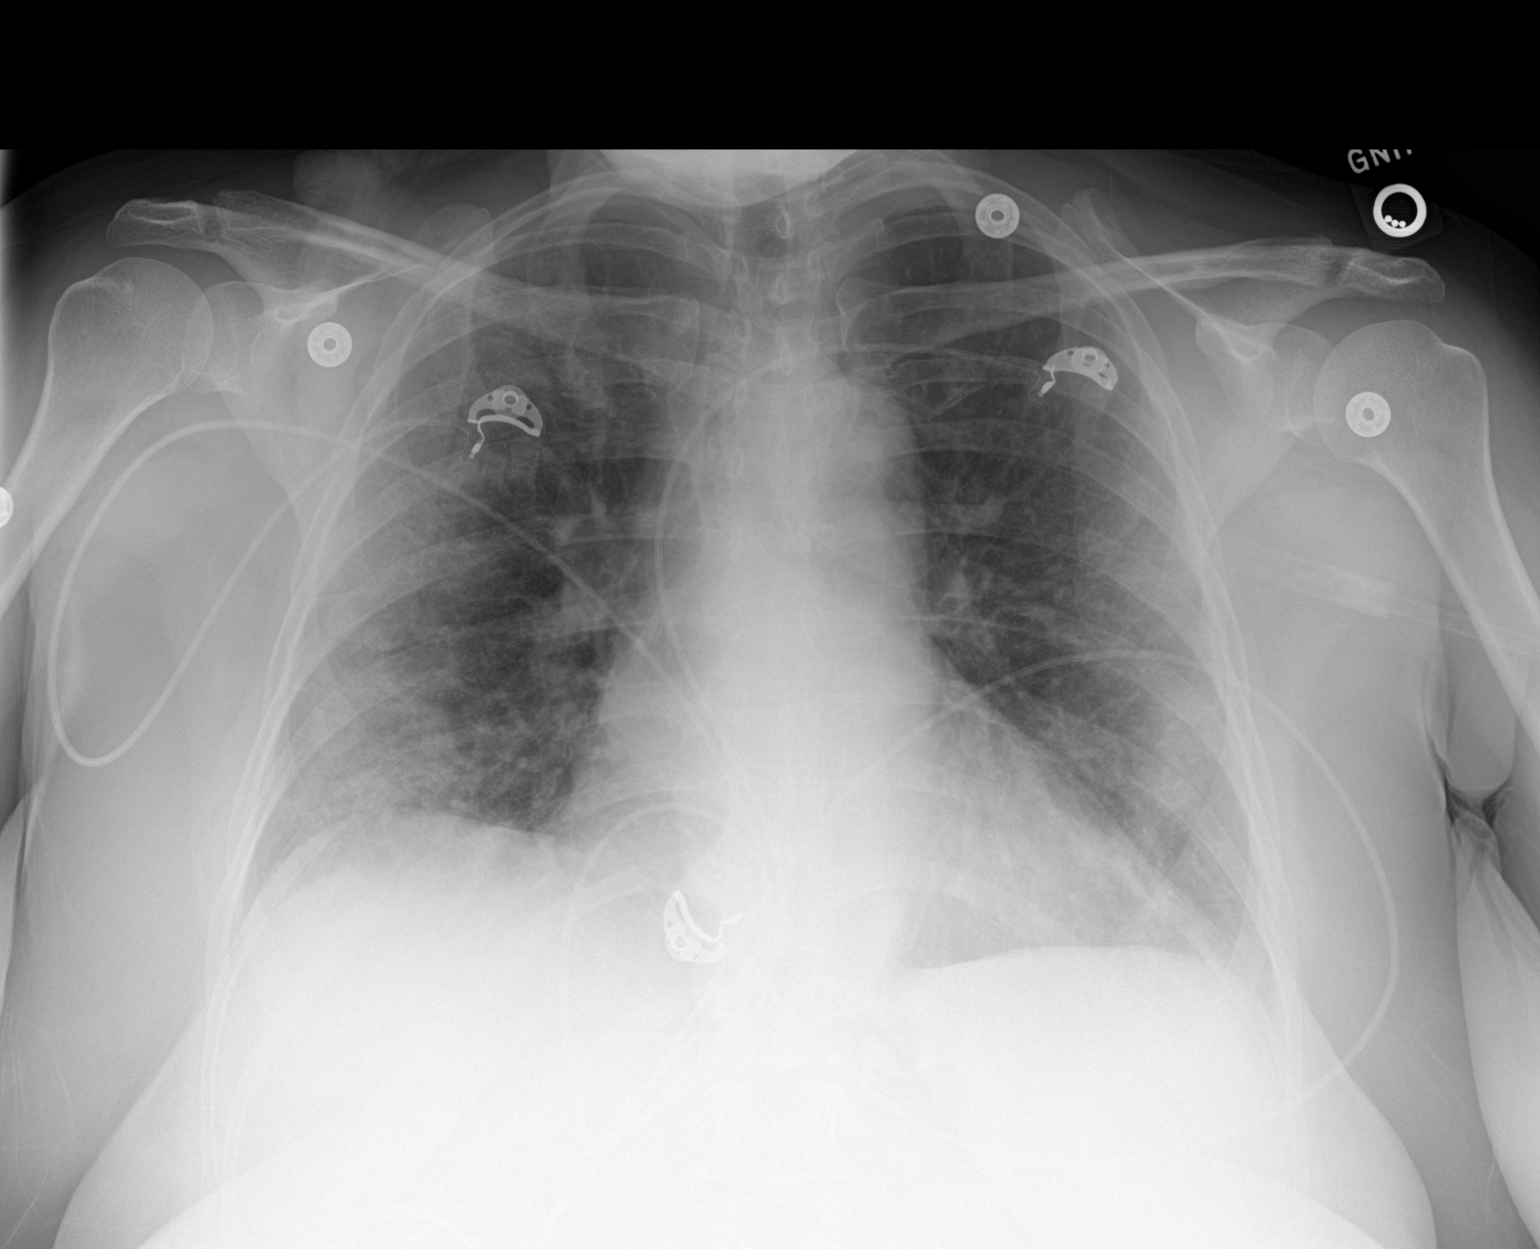

[1 of 1 positions shown; findings below may reference images not displayed]

FINDINGS: Cardiac shadow is stable. Persistent airspace opacities are noted
bilaterally consistent with the given clinical history. No acute
abnormality noted.
IMPRESSION: Persistent bilateral airspace opacities consistent with the given
clinical history.

## 2022-08-01 ENCOUNTER — Other Ambulatory Visit: Payer: Self-pay | Admitting: Family Medicine

## 2022-08-09 DIAGNOSIS — Z01419 Encounter for gynecological examination (general) (routine) without abnormal findings: Secondary | ICD-10-CM | POA: Diagnosis not present

## 2022-08-09 DIAGNOSIS — Z1231 Encounter for screening mammogram for malignant neoplasm of breast: Secondary | ICD-10-CM | POA: Diagnosis not present

## 2022-08-09 DIAGNOSIS — Z124 Encounter for screening for malignant neoplasm of cervix: Secondary | ICD-10-CM | POA: Diagnosis not present

## 2022-08-09 DIAGNOSIS — Z6841 Body Mass Index (BMI) 40.0 and over, adult: Secondary | ICD-10-CM | POA: Diagnosis not present

## 2022-09-27 DIAGNOSIS — Z6841 Body Mass Index (BMI) 40.0 and over, adult: Secondary | ICD-10-CM | POA: Diagnosis not present

## 2022-09-27 DIAGNOSIS — Z713 Dietary counseling and surveillance: Secondary | ICD-10-CM | POA: Diagnosis not present

## 2022-09-30 DIAGNOSIS — S81811A Laceration without foreign body, right lower leg, initial encounter: Secondary | ICD-10-CM | POA: Diagnosis not present

## 2022-10-25 DIAGNOSIS — Z13820 Encounter for screening for osteoporosis: Secondary | ICD-10-CM | POA: Diagnosis not present

## 2022-11-09 DIAGNOSIS — N390 Urinary tract infection, site not specified: Secondary | ICD-10-CM | POA: Diagnosis not present

## 2022-12-25 ENCOUNTER — Other Ambulatory Visit: Payer: Self-pay | Admitting: Family Medicine

## 2023-03-22 ENCOUNTER — Other Ambulatory Visit: Payer: Self-pay | Admitting: Family Medicine

## 2023-04-04 DIAGNOSIS — F3341 Major depressive disorder, recurrent, in partial remission: Secondary | ICD-10-CM | POA: Diagnosis not present

## 2023-04-04 DIAGNOSIS — E039 Hypothyroidism, unspecified: Secondary | ICD-10-CM | POA: Diagnosis not present

## 2023-04-04 DIAGNOSIS — Z23 Encounter for immunization: Secondary | ICD-10-CM | POA: Diagnosis not present

## 2023-05-03 ENCOUNTER — Other Ambulatory Visit: Payer: Self-pay | Admitting: Family Medicine

## 2023-06-05 DIAGNOSIS — M9904 Segmental and somatic dysfunction of sacral region: Secondary | ICD-10-CM | POA: Diagnosis not present

## 2023-06-05 DIAGNOSIS — M9903 Segmental and somatic dysfunction of lumbar region: Secondary | ICD-10-CM | POA: Diagnosis not present

## 2023-06-05 DIAGNOSIS — M9901 Segmental and somatic dysfunction of cervical region: Secondary | ICD-10-CM | POA: Diagnosis not present

## 2023-06-05 DIAGNOSIS — M5417 Radiculopathy, lumbosacral region: Secondary | ICD-10-CM | POA: Diagnosis not present

## 2023-06-05 DIAGNOSIS — M9902 Segmental and somatic dysfunction of thoracic region: Secondary | ICD-10-CM | POA: Diagnosis not present

## 2023-07-04 DIAGNOSIS — E039 Hypothyroidism, unspecified: Secondary | ICD-10-CM | POA: Diagnosis not present

## 2023-07-04 DIAGNOSIS — E559 Vitamin D deficiency, unspecified: Secondary | ICD-10-CM | POA: Diagnosis not present

## 2023-07-04 DIAGNOSIS — Z0000 Encounter for general adult medical examination without abnormal findings: Secondary | ICD-10-CM | POA: Diagnosis not present

## 2023-07-04 DIAGNOSIS — F3341 Major depressive disorder, recurrent, in partial remission: Secondary | ICD-10-CM | POA: Diagnosis not present

## 2023-07-04 DIAGNOSIS — K219 Gastro-esophageal reflux disease without esophagitis: Secondary | ICD-10-CM | POA: Diagnosis not present

## 2023-07-04 DIAGNOSIS — E782 Mixed hyperlipidemia: Secondary | ICD-10-CM | POA: Diagnosis not present

## 2023-07-04 DIAGNOSIS — J309 Allergic rhinitis, unspecified: Secondary | ICD-10-CM | POA: Diagnosis not present

## 2023-07-04 DIAGNOSIS — G500 Trigeminal neuralgia: Secondary | ICD-10-CM | POA: Diagnosis not present

## 2023-09-13 DIAGNOSIS — Z6838 Body mass index (BMI) 38.0-38.9, adult: Secondary | ICD-10-CM | POA: Diagnosis not present

## 2023-09-13 DIAGNOSIS — Z124 Encounter for screening for malignant neoplasm of cervix: Secondary | ICD-10-CM | POA: Diagnosis not present

## 2023-09-13 DIAGNOSIS — Z01419 Encounter for gynecological examination (general) (routine) without abnormal findings: Secondary | ICD-10-CM | POA: Diagnosis not present

## 2023-09-13 DIAGNOSIS — Z1231 Encounter for screening mammogram for malignant neoplasm of breast: Secondary | ICD-10-CM | POA: Diagnosis not present

## 2023-11-09 DIAGNOSIS — M9903 Segmental and somatic dysfunction of lumbar region: Secondary | ICD-10-CM | POA: Diagnosis not present

## 2023-11-09 DIAGNOSIS — M9902 Segmental and somatic dysfunction of thoracic region: Secondary | ICD-10-CM | POA: Diagnosis not present

## 2023-11-09 DIAGNOSIS — M9904 Segmental and somatic dysfunction of sacral region: Secondary | ICD-10-CM | POA: Diagnosis not present

## 2023-11-09 DIAGNOSIS — M531 Cervicobrachial syndrome: Secondary | ICD-10-CM | POA: Diagnosis not present

## 2023-11-09 DIAGNOSIS — M9901 Segmental and somatic dysfunction of cervical region: Secondary | ICD-10-CM | POA: Diagnosis not present

## 2023-11-21 DIAGNOSIS — M9902 Segmental and somatic dysfunction of thoracic region: Secondary | ICD-10-CM | POA: Diagnosis not present

## 2023-11-21 DIAGNOSIS — M9901 Segmental and somatic dysfunction of cervical region: Secondary | ICD-10-CM | POA: Diagnosis not present

## 2023-11-21 DIAGNOSIS — M9903 Segmental and somatic dysfunction of lumbar region: Secondary | ICD-10-CM | POA: Diagnosis not present

## 2023-11-21 DIAGNOSIS — M531 Cervicobrachial syndrome: Secondary | ICD-10-CM | POA: Diagnosis not present

## 2023-11-24 DIAGNOSIS — N390 Urinary tract infection, site not specified: Secondary | ICD-10-CM | POA: Diagnosis not present

## 2023-11-27 DIAGNOSIS — M9901 Segmental and somatic dysfunction of cervical region: Secondary | ICD-10-CM | POA: Diagnosis not present

## 2023-11-27 DIAGNOSIS — M9902 Segmental and somatic dysfunction of thoracic region: Secondary | ICD-10-CM | POA: Diagnosis not present

## 2023-11-27 DIAGNOSIS — M531 Cervicobrachial syndrome: Secondary | ICD-10-CM | POA: Diagnosis not present

## 2023-11-27 DIAGNOSIS — M9903 Segmental and somatic dysfunction of lumbar region: Secondary | ICD-10-CM | POA: Diagnosis not present

## 2023-12-06 DIAGNOSIS — R399 Unspecified symptoms and signs involving the genitourinary system: Secondary | ICD-10-CM | POA: Diagnosis not present

## 2023-12-06 DIAGNOSIS — R300 Dysuria: Secondary | ICD-10-CM | POA: Diagnosis not present

## 2023-12-11 DIAGNOSIS — M9902 Segmental and somatic dysfunction of thoracic region: Secondary | ICD-10-CM | POA: Diagnosis not present

## 2023-12-11 DIAGNOSIS — M531 Cervicobrachial syndrome: Secondary | ICD-10-CM | POA: Diagnosis not present

## 2023-12-11 DIAGNOSIS — M9903 Segmental and somatic dysfunction of lumbar region: Secondary | ICD-10-CM | POA: Diagnosis not present

## 2023-12-11 DIAGNOSIS — M9901 Segmental and somatic dysfunction of cervical region: Secondary | ICD-10-CM | POA: Diagnosis not present

## 2024-01-25 DIAGNOSIS — N390 Urinary tract infection, site not specified: Secondary | ICD-10-CM | POA: Diagnosis not present

## 2024-03-16 DIAGNOSIS — N3001 Acute cystitis with hematuria: Secondary | ICD-10-CM | POA: Diagnosis not present
